# Patient Record
Sex: Male | Born: 1940 | Race: White | Hispanic: No | Marital: Married | Smoking: Never smoker
Health system: Southern US, Community
[De-identification: ages and names within clinical notes are randomized; demographics above are authoritative.]

---

## 2015-06-12 ENCOUNTER — Other Ambulatory Visit: Payer: Self-pay | Admitting: Specialist

## 2015-06-12 DIAGNOSIS — R413 Other amnesia: Secondary | ICD-10-CM

## 2015-06-20 ENCOUNTER — Other Ambulatory Visit: Payer: Self-pay

## 2015-07-28 DIAGNOSIS — I63339 Cerebral infarction due to thrombosis of unspecified posterior cerebral artery: Secondary | ICD-10-CM | POA: Insufficient documentation

## 2015-07-28 DIAGNOSIS — G609 Hereditary and idiopathic neuropathy, unspecified: Secondary | ICD-10-CM | POA: Insufficient documentation

## 2015-07-28 DIAGNOSIS — Z9181 History of falling: Secondary | ICD-10-CM | POA: Insufficient documentation

## 2015-07-28 DIAGNOSIS — I639 Cerebral infarction, unspecified: Secondary | ICD-10-CM | POA: Insufficient documentation

## 2016-09-02 ENCOUNTER — Encounter: Payer: Self-pay | Admitting: Sports Medicine

## 2016-09-02 ENCOUNTER — Ambulatory Visit (INDEPENDENT_AMBULATORY_CARE_PROVIDER_SITE_OTHER): Payer: BLUE CROSS/BLUE SHIELD | Admitting: Sports Medicine

## 2016-09-02 DIAGNOSIS — Z7901 Long term (current) use of anticoagulants: Secondary | ICD-10-CM | POA: Diagnosis not present

## 2016-09-02 DIAGNOSIS — M205X1 Other deformities of toe(s) (acquired), right foot: Secondary | ICD-10-CM | POA: Diagnosis not present

## 2016-09-02 DIAGNOSIS — M79676 Pain in unspecified toe(s): Secondary | ICD-10-CM | POA: Diagnosis not present

## 2016-09-02 DIAGNOSIS — B351 Tinea unguium: Secondary | ICD-10-CM

## 2016-09-02 NOTE — Progress Notes (Signed)
  Subjective: Ronald Sheppard is a 76 y.o. male patient seen today in office with complaint of painful thickened and elongated toenails; unable to trim especially at right 2nd toenail and want to talk about treatment options. Patient denies history of Diabetes, Neuropathy, or known Vascular disease; on Plavix for infarct. Patient has no other pedal complaints at this time.   Patient Active Problem List   Diagnosis Date Noted  . 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 Idiopathic peripheral neuropathy 07/28/2015  . Risk for falls 07/28/2015    No current outpatient prescriptions on file prior to visit.   No current facility-administered medications on file prior to visit.     Allergies  Allergen Reactions  . Amlodipine   . Other     MSG  . Penicillins     Objective: Physical Exam  General: Well developed, nourished, no acute distress, awake, alert and oriented x 3  Vascular: Dorsalis pedis artery 1/4 bilateral, Posterior tibial artery 1/4 bilateral, skin temperature warm to warm proximal to distal bilateral lower extremities, mild varicosities, scant pedal hair present bilateral.  Neurological: Gross sensation present via light touch bilateral.   Dermatological: Skin is warm, dry, and supple bilateral, Nails 1-10 are tender, long, thick, and discolored with mild subungal debris with right 2nd toenail most involved, no webspace macerations present bilateral, no open lesions present bilateral, no callus/corns/hyperkeratotic tissue present bilateral. No signs of infection bilateral.  Musculoskeletal: Asymptomatic right 2nd claw toe boney deformities noted. Muscular strength within normal limits without painon range of motion. No pain with calf compression bilateral.  Assessment and Plan:  Problem List Items Addressed This Visit    None    Visit Diagnoses    Dermatophytosis, nail    -  Primary   Pain of toe, unspecified laterality       Claw toe, right       2nd toe   Current use of long term anticoagulation          -Examined patient.  -Discussed treatment options for painful mycotic nails. -Mechanically debrided and reduced mycotic nails with sterile nail nipper and dremel nail file without incident. -Recommend daily tea tree oil to nails since patient does not want Rx or fungal culture at this time -Gave toe cap for right 2nd claw toe -Recommend good supportive shoes daily  -Patient to return as needed or sooner if symptoms worsen.  Landis Martins, DPM

## 2017-07-06 DIAGNOSIS — J209 Acute bronchitis, unspecified: Secondary | ICD-10-CM | POA: Diagnosis not present

## 2017-10-15 DIAGNOSIS — L0390 Cellulitis, unspecified: Secondary | ICD-10-CM | POA: Diagnosis not present

## 2017-10-24 DIAGNOSIS — L0390 Cellulitis, unspecified: Secondary | ICD-10-CM | POA: Diagnosis not present

## 2017-12-22 DIAGNOSIS — L97519 Non-pressure chronic ulcer of other part of right foot with unspecified severity: Secondary | ICD-10-CM | POA: Diagnosis not present

## 2017-12-29 DIAGNOSIS — E119 Type 2 diabetes mellitus without complications: Secondary | ICD-10-CM | POA: Diagnosis not present

## 2018-01-18 DIAGNOSIS — R21 Rash and other nonspecific skin eruption: Secondary | ICD-10-CM | POA: Diagnosis not present

## 2018-01-18 DIAGNOSIS — A932 Colorado tick fever: Secondary | ICD-10-CM | POA: Diagnosis not present

## 2018-03-09 DIAGNOSIS — S91012A Laceration without foreign body, left ankle, initial encounter: Secondary | ICD-10-CM | POA: Diagnosis not present

## 2018-04-19 DIAGNOSIS — H2513 Age-related nuclear cataract, bilateral: Secondary | ICD-10-CM | POA: Diagnosis not present

## 2018-04-19 DIAGNOSIS — H02834 Dermatochalasis of left upper eyelid: Secondary | ICD-10-CM | POA: Diagnosis not present

## 2018-04-19 DIAGNOSIS — H353131 Nonexudative age-related macular degeneration, bilateral, early dry stage: Secondary | ICD-10-CM | POA: Diagnosis not present

## 2018-04-19 DIAGNOSIS — H02831 Dermatochalasis of right upper eyelid: Secondary | ICD-10-CM | POA: Diagnosis not present

## 2018-04-19 DIAGNOSIS — H4323 Crystalline deposits in vitreous body, bilateral: Secondary | ICD-10-CM | POA: Diagnosis not present

## 2018-05-05 DIAGNOSIS — S0512XA Contusion of eyeball and orbital tissues, left eye, initial encounter: Secondary | ICD-10-CM | POA: Diagnosis not present

## 2018-08-04 DIAGNOSIS — H10019 Acute follicular conjunctivitis, unspecified eye: Secondary | ICD-10-CM | POA: Diagnosis not present

## 2018-10-07 DIAGNOSIS — R1311 Dysphagia, oral phase: Secondary | ICD-10-CM | POA: Diagnosis not present

## 2018-10-07 DIAGNOSIS — Z7409 Other reduced mobility: Secondary | ICD-10-CM | POA: Diagnosis not present

## 2018-10-07 DIAGNOSIS — R4182 Altered mental status, unspecified: Secondary | ICD-10-CM | POA: Diagnosis not present

## 2018-10-07 DIAGNOSIS — R319 Hematuria, unspecified: Secondary | ICD-10-CM | POA: Diagnosis not present

## 2018-10-07 DIAGNOSIS — N400 Enlarged prostate without lower urinary tract symptoms: Secondary | ICD-10-CM | POA: Diagnosis not present

## 2018-10-07 DIAGNOSIS — Z8673 Personal history of transient ischemic attack (TIA), and cerebral infarction without residual deficits: Secondary | ICD-10-CM | POA: Diagnosis not present

## 2018-10-07 DIAGNOSIS — R310 Gross hematuria: Secondary | ICD-10-CM | POA: Diagnosis not present

## 2018-10-07 DIAGNOSIS — I10 Essential (primary) hypertension: Secondary | ICD-10-CM | POA: Diagnosis not present

## 2018-10-07 DIAGNOSIS — R9401 Abnormal electroencephalogram [EEG]: Secondary | ICD-10-CM | POA: Diagnosis not present

## 2018-10-07 DIAGNOSIS — I2510 Atherosclerotic heart disease of native coronary artery without angina pectoris: Secondary | ICD-10-CM | POA: Diagnosis not present

## 2018-10-07 DIAGNOSIS — I213 ST elevation (STEMI) myocardial infarction of unspecified site: Secondary | ICD-10-CM | POA: Diagnosis not present

## 2018-10-07 DIAGNOSIS — Z6833 Body mass index (BMI) 33.0-33.9, adult: Secondary | ICD-10-CM | POA: Diagnosis not present

## 2018-10-07 DIAGNOSIS — Z955 Presence of coronary angioplasty implant and graft: Secondary | ICD-10-CM | POA: Diagnosis not present

## 2018-10-07 DIAGNOSIS — I469 Cardiac arrest, cause unspecified: Secondary | ICD-10-CM | POA: Diagnosis not present

## 2018-10-07 DIAGNOSIS — I2102 ST elevation (STEMI) myocardial infarction involving left anterior descending coronary artery: Secondary | ICD-10-CM | POA: Diagnosis not present

## 2018-10-07 DIAGNOSIS — E785 Hyperlipidemia, unspecified: Secondary | ICD-10-CM | POA: Diagnosis not present

## 2018-10-08 DIAGNOSIS — E785 Hyperlipidemia, unspecified: Secondary | ICD-10-CM | POA: Diagnosis not present

## 2018-10-08 DIAGNOSIS — R9401 Abnormal electroencephalogram [EEG]: Secondary | ICD-10-CM | POA: Diagnosis not present

## 2018-10-08 DIAGNOSIS — I2510 Atherosclerotic heart disease of native coronary artery without angina pectoris: Secondary | ICD-10-CM | POA: Diagnosis not present

## 2018-10-08 DIAGNOSIS — I2102 ST elevation (STEMI) myocardial infarction involving left anterior descending coronary artery: Secondary | ICD-10-CM | POA: Diagnosis not present

## 2018-10-08 DIAGNOSIS — Z6833 Body mass index (BMI) 33.0-33.9, adult: Secondary | ICD-10-CM | POA: Diagnosis not present

## 2018-10-08 DIAGNOSIS — N400 Enlarged prostate without lower urinary tract symptoms: Secondary | ICD-10-CM | POA: Diagnosis not present

## 2018-10-08 DIAGNOSIS — R319 Hematuria, unspecified: Secondary | ICD-10-CM | POA: Diagnosis not present

## 2018-10-08 DIAGNOSIS — R310 Gross hematuria: Secondary | ICD-10-CM | POA: Diagnosis not present

## 2018-10-08 DIAGNOSIS — I10 Essential (primary) hypertension: Secondary | ICD-10-CM | POA: Diagnosis not present

## 2018-10-08 DIAGNOSIS — I469 Cardiac arrest, cause unspecified: Secondary | ICD-10-CM | POA: Diagnosis not present

## 2018-10-08 DIAGNOSIS — Z955 Presence of coronary angioplasty implant and graft: Secondary | ICD-10-CM | POA: Diagnosis not present

## 2018-10-09 DIAGNOSIS — I2510 Atherosclerotic heart disease of native coronary artery without angina pectoris: Secondary | ICD-10-CM | POA: Diagnosis not present

## 2018-10-09 DIAGNOSIS — R9401 Abnormal electroencephalogram [EEG]: Secondary | ICD-10-CM | POA: Diagnosis not present

## 2018-10-09 DIAGNOSIS — R001 Bradycardia, unspecified: Secondary | ICD-10-CM | POA: Diagnosis not present

## 2018-10-09 DIAGNOSIS — I2102 ST elevation (STEMI) myocardial infarction involving left anterior descending coronary artery: Secondary | ICD-10-CM | POA: Diagnosis not present

## 2018-10-09 DIAGNOSIS — I10 Essential (primary) hypertension: Secondary | ICD-10-CM | POA: Diagnosis not present

## 2018-10-09 DIAGNOSIS — R310 Gross hematuria: Secondary | ICD-10-CM | POA: Diagnosis not present

## 2018-10-09 DIAGNOSIS — E785 Hyperlipidemia, unspecified: Secondary | ICD-10-CM | POA: Diagnosis not present

## 2018-10-09 DIAGNOSIS — Z6834 Body mass index (BMI) 34.0-34.9, adult: Secondary | ICD-10-CM | POA: Diagnosis not present

## 2018-10-09 DIAGNOSIS — I469 Cardiac arrest, cause unspecified: Secondary | ICD-10-CM | POA: Diagnosis not present

## 2018-10-10 DIAGNOSIS — Z8673 Personal history of transient ischemic attack (TIA), and cerebral infarction without residual deficits: Secondary | ICD-10-CM | POA: Diagnosis not present

## 2018-10-10 DIAGNOSIS — I4901 Ventricular fibrillation: Secondary | ICD-10-CM | POA: Diagnosis not present

## 2018-10-10 DIAGNOSIS — R310 Gross hematuria: Secondary | ICD-10-CM | POA: Diagnosis not present

## 2018-10-10 DIAGNOSIS — J189 Pneumonia, unspecified organism: Secondary | ICD-10-CM | POA: Diagnosis not present

## 2018-10-10 DIAGNOSIS — R600 Localized edema: Secondary | ICD-10-CM | POA: Diagnosis not present

## 2018-10-10 DIAGNOSIS — E785 Hyperlipidemia, unspecified: Secondary | ICD-10-CM | POA: Diagnosis not present

## 2018-10-10 DIAGNOSIS — Z955 Presence of coronary angioplasty implant and graft: Secondary | ICD-10-CM | POA: Diagnosis not present

## 2018-10-10 DIAGNOSIS — I2102 ST elevation (STEMI) myocardial infarction involving left anterior descending coronary artery: Secondary | ICD-10-CM | POA: Diagnosis not present

## 2018-10-10 DIAGNOSIS — I110 Hypertensive heart disease with heart failure: Secondary | ICD-10-CM | POA: Diagnosis not present

## 2018-10-10 DIAGNOSIS — I469 Cardiac arrest, cause unspecified: Secondary | ICD-10-CM | POA: Diagnosis not present

## 2018-10-10 DIAGNOSIS — I472 Ventricular tachycardia: Secondary | ICD-10-CM | POA: Diagnosis not present

## 2018-10-10 DIAGNOSIS — I2510 Atherosclerotic heart disease of native coronary artery without angina pectoris: Secondary | ICD-10-CM | POA: Diagnosis not present

## 2018-10-10 DIAGNOSIS — Z6834 Body mass index (BMI) 34.0-34.9, adult: Secondary | ICD-10-CM | POA: Diagnosis not present

## 2018-10-10 DIAGNOSIS — I5021 Acute systolic (congestive) heart failure: Secondary | ICD-10-CM | POA: Diagnosis not present

## 2018-10-11 DIAGNOSIS — I5021 Acute systolic (congestive) heart failure: Secondary | ICD-10-CM | POA: Diagnosis not present

## 2018-10-11 DIAGNOSIS — Z6834 Body mass index (BMI) 34.0-34.9, adult: Secondary | ICD-10-CM | POA: Diagnosis not present

## 2018-10-11 DIAGNOSIS — I110 Hypertensive heart disease with heart failure: Secondary | ICD-10-CM | POA: Diagnosis not present

## 2018-10-11 DIAGNOSIS — R310 Gross hematuria: Secondary | ICD-10-CM | POA: Diagnosis not present

## 2018-10-11 DIAGNOSIS — I4901 Ventricular fibrillation: Secondary | ICD-10-CM | POA: Diagnosis not present

## 2018-10-11 DIAGNOSIS — I472 Ventricular tachycardia: Secondary | ICD-10-CM | POA: Diagnosis not present

## 2018-10-11 DIAGNOSIS — I2510 Atherosclerotic heart disease of native coronary artery without angina pectoris: Secondary | ICD-10-CM | POA: Diagnosis not present

## 2018-10-11 DIAGNOSIS — Z955 Presence of coronary angioplasty implant and graft: Secondary | ICD-10-CM | POA: Diagnosis not present

## 2018-10-11 DIAGNOSIS — I469 Cardiac arrest, cause unspecified: Secondary | ICD-10-CM | POA: Diagnosis not present

## 2018-10-11 DIAGNOSIS — I2102 ST elevation (STEMI) myocardial infarction involving left anterior descending coronary artery: Secondary | ICD-10-CM | POA: Diagnosis not present

## 2018-10-11 DIAGNOSIS — J189 Pneumonia, unspecified organism: Secondary | ICD-10-CM | POA: Diagnosis not present

## 2018-10-11 DIAGNOSIS — E785 Hyperlipidemia, unspecified: Secondary | ICD-10-CM | POA: Diagnosis not present

## 2018-10-12 DIAGNOSIS — I10 Essential (primary) hypertension: Secondary | ICD-10-CM | POA: Diagnosis not present

## 2018-10-12 DIAGNOSIS — Z955 Presence of coronary angioplasty implant and graft: Secondary | ICD-10-CM | POA: Diagnosis not present

## 2018-10-12 DIAGNOSIS — I2510 Atherosclerotic heart disease of native coronary artery without angina pectoris: Secondary | ICD-10-CM | POA: Diagnosis not present

## 2018-10-12 DIAGNOSIS — I2102 ST elevation (STEMI) myocardial infarction involving left anterior descending coronary artery: Secondary | ICD-10-CM | POA: Diagnosis not present

## 2018-10-12 DIAGNOSIS — I2109 ST elevation (STEMI) myocardial infarction involving other coronary artery of anterior wall: Secondary | ICD-10-CM | POA: Diagnosis not present

## 2018-10-12 DIAGNOSIS — I469 Cardiac arrest, cause unspecified: Secondary | ICD-10-CM | POA: Diagnosis not present

## 2018-10-12 DIAGNOSIS — I472 Ventricular tachycardia: Secondary | ICD-10-CM | POA: Diagnosis not present

## 2018-10-12 DIAGNOSIS — Z8673 Personal history of transient ischemic attack (TIA), and cerebral infarction without residual deficits: Secondary | ICD-10-CM | POA: Diagnosis not present

## 2018-10-14 DIAGNOSIS — R454 Irritability and anger: Secondary | ICD-10-CM | POA: Diagnosis not present

## 2018-10-16 DIAGNOSIS — G931 Anoxic brain damage, not elsewhere classified: Secondary | ICD-10-CM | POA: Diagnosis not present

## 2018-10-16 DIAGNOSIS — Z95810 Presence of automatic (implantable) cardiac defibrillator: Secondary | ICD-10-CM | POA: Diagnosis not present

## 2018-10-16 DIAGNOSIS — I480 Paroxysmal atrial fibrillation: Secondary | ICD-10-CM | POA: Diagnosis not present

## 2018-10-16 DIAGNOSIS — M25511 Pain in right shoulder: Secondary | ICD-10-CM | POA: Diagnosis not present

## 2018-10-16 DIAGNOSIS — Z7901 Long term (current) use of anticoagulants: Secondary | ICD-10-CM | POA: Diagnosis not present

## 2018-10-16 DIAGNOSIS — E785 Hyperlipidemia, unspecified: Secondary | ICD-10-CM | POA: Diagnosis not present

## 2018-10-16 DIAGNOSIS — G8929 Other chronic pain: Secondary | ICD-10-CM | POA: Diagnosis not present

## 2018-10-16 DIAGNOSIS — I110 Hypertensive heart disease with heart failure: Secondary | ICD-10-CM | POA: Diagnosis not present

## 2018-10-16 DIAGNOSIS — I5020 Unspecified systolic (congestive) heart failure: Secondary | ICD-10-CM | POA: Diagnosis not present

## 2018-10-16 DIAGNOSIS — I213 ST elevation (STEMI) myocardial infarction of unspecified site: Secondary | ICD-10-CM | POA: Diagnosis not present

## 2018-10-16 DIAGNOSIS — I808 Phlebitis and thrombophlebitis of other sites: Secondary | ICD-10-CM | POA: Diagnosis not present

## 2018-10-16 DIAGNOSIS — Z7409 Other reduced mobility: Secondary | ICD-10-CM | POA: Diagnosis not present

## 2018-10-17 DIAGNOSIS — M25511 Pain in right shoulder: Secondary | ICD-10-CM | POA: Diagnosis not present

## 2018-10-17 DIAGNOSIS — Z7409 Other reduced mobility: Secondary | ICD-10-CM | POA: Diagnosis not present

## 2018-10-17 DIAGNOSIS — I5020 Unspecified systolic (congestive) heart failure: Secondary | ICD-10-CM | POA: Diagnosis not present

## 2018-10-17 DIAGNOSIS — I2510 Atherosclerotic heart disease of native coronary artery without angina pectoris: Secondary | ICD-10-CM | POA: Diagnosis not present

## 2018-10-17 DIAGNOSIS — E785 Hyperlipidemia, unspecified: Secondary | ICD-10-CM | POA: Diagnosis not present

## 2018-10-17 DIAGNOSIS — I4891 Unspecified atrial fibrillation: Secondary | ICD-10-CM | POA: Diagnosis not present

## 2018-10-17 DIAGNOSIS — I110 Hypertensive heart disease with heart failure: Secondary | ICD-10-CM | POA: Diagnosis not present

## 2018-10-17 DIAGNOSIS — G8929 Other chronic pain: Secondary | ICD-10-CM | POA: Diagnosis not present

## 2018-10-17 DIAGNOSIS — Z95810 Presence of automatic (implantable) cardiac defibrillator: Secondary | ICD-10-CM | POA: Diagnosis not present

## 2018-10-17 DIAGNOSIS — G931 Anoxic brain damage, not elsewhere classified: Secondary | ICD-10-CM | POA: Diagnosis not present

## 2018-10-17 DIAGNOSIS — Z7901 Long term (current) use of anticoagulants: Secondary | ICD-10-CM | POA: Diagnosis not present

## 2018-10-17 DIAGNOSIS — I2109 ST elevation (STEMI) myocardial infarction involving other coronary artery of anterior wall: Secondary | ICD-10-CM | POA: Diagnosis not present

## 2018-10-18 DIAGNOSIS — I2510 Atherosclerotic heart disease of native coronary artery without angina pectoris: Secondary | ICD-10-CM | POA: Diagnosis not present

## 2018-10-18 DIAGNOSIS — I808 Phlebitis and thrombophlebitis of other sites: Secondary | ICD-10-CM | POA: Diagnosis not present

## 2018-10-18 DIAGNOSIS — G8929 Other chronic pain: Secondary | ICD-10-CM | POA: Diagnosis not present

## 2018-10-18 DIAGNOSIS — M25511 Pain in right shoulder: Secondary | ICD-10-CM | POA: Diagnosis not present

## 2018-10-18 DIAGNOSIS — Z7409 Other reduced mobility: Secondary | ICD-10-CM | POA: Diagnosis not present

## 2018-10-18 DIAGNOSIS — I5020 Unspecified systolic (congestive) heart failure: Secondary | ICD-10-CM | POA: Diagnosis not present

## 2018-10-18 DIAGNOSIS — Z7901 Long term (current) use of anticoagulants: Secondary | ICD-10-CM | POA: Diagnosis not present

## 2018-10-18 DIAGNOSIS — I110 Hypertensive heart disease with heart failure: Secondary | ICD-10-CM | POA: Diagnosis not present

## 2018-10-18 DIAGNOSIS — E785 Hyperlipidemia, unspecified: Secondary | ICD-10-CM | POA: Diagnosis not present

## 2018-10-18 DIAGNOSIS — Z95810 Presence of automatic (implantable) cardiac defibrillator: Secondary | ICD-10-CM | POA: Diagnosis not present

## 2018-10-18 DIAGNOSIS — I4891 Unspecified atrial fibrillation: Secondary | ICD-10-CM | POA: Diagnosis not present

## 2018-10-18 DIAGNOSIS — I220 Subsequent ST elevation (STEMI) myocardial infarction of anterior wall: Secondary | ICD-10-CM | POA: Diagnosis not present

## 2018-10-19 DIAGNOSIS — M25511 Pain in right shoulder: Secondary | ICD-10-CM | POA: Diagnosis not present

## 2018-10-19 DIAGNOSIS — Z7901 Long term (current) use of anticoagulants: Secondary | ICD-10-CM | POA: Diagnosis not present

## 2018-10-19 DIAGNOSIS — Z7409 Other reduced mobility: Secondary | ICD-10-CM | POA: Diagnosis not present

## 2018-10-19 DIAGNOSIS — E785 Hyperlipidemia, unspecified: Secondary | ICD-10-CM | POA: Diagnosis not present

## 2018-10-19 DIAGNOSIS — I469 Cardiac arrest, cause unspecified: Secondary | ICD-10-CM | POA: Diagnosis not present

## 2018-10-19 DIAGNOSIS — I220 Subsequent ST elevation (STEMI) myocardial infarction of anterior wall: Secondary | ICD-10-CM | POA: Diagnosis not present

## 2018-10-19 DIAGNOSIS — I4891 Unspecified atrial fibrillation: Secondary | ICD-10-CM | POA: Diagnosis not present

## 2018-10-19 DIAGNOSIS — Z95810 Presence of automatic (implantable) cardiac defibrillator: Secondary | ICD-10-CM | POA: Diagnosis not present

## 2018-10-19 DIAGNOSIS — I5020 Unspecified systolic (congestive) heart failure: Secondary | ICD-10-CM | POA: Diagnosis not present

## 2018-10-19 DIAGNOSIS — I2510 Atherosclerotic heart disease of native coronary artery without angina pectoris: Secondary | ICD-10-CM | POA: Diagnosis not present

## 2018-10-19 DIAGNOSIS — I110 Hypertensive heart disease with heart failure: Secondary | ICD-10-CM | POA: Diagnosis not present

## 2018-10-19 DIAGNOSIS — G8929 Other chronic pain: Secondary | ICD-10-CM | POA: Diagnosis not present

## 2021-07-17 DIAGNOSIS — M545 Low back pain, unspecified: Secondary | ICD-10-CM | POA: Diagnosis not present

## 2021-07-17 DIAGNOSIS — R778 Other specified abnormalities of plasma proteins: Secondary | ICD-10-CM

## 2021-07-17 DIAGNOSIS — I249 Acute ischemic heart disease, unspecified: Secondary | ICD-10-CM | POA: Diagnosis not present

## 2021-07-17 DIAGNOSIS — R339 Retention of urine, unspecified: Secondary | ICD-10-CM

## 2021-07-17 DIAGNOSIS — I214 Non-ST elevation (NSTEMI) myocardial infarction: Secondary | ICD-10-CM | POA: Diagnosis not present

## 2021-07-17 DIAGNOSIS — I4892 Unspecified atrial flutter: Secondary | ICD-10-CM | POA: Diagnosis not present

## 2021-07-18 DIAGNOSIS — Z7901 Long term (current) use of anticoagulants: Secondary | ICD-10-CM | POA: Diagnosis not present

## 2021-07-18 DIAGNOSIS — Z950 Presence of cardiac pacemaker: Secondary | ICD-10-CM | POA: Diagnosis not present

## 2021-07-18 DIAGNOSIS — I249 Acute ischemic heart disease, unspecified: Secondary | ICD-10-CM | POA: Diagnosis not present

## 2021-07-18 DIAGNOSIS — E871 Hypo-osmolality and hyponatremia: Secondary | ICD-10-CM

## 2021-07-18 DIAGNOSIS — I4892 Unspecified atrial flutter: Secondary | ICD-10-CM | POA: Diagnosis not present

## 2021-07-18 DIAGNOSIS — I2510 Atherosclerotic heart disease of native coronary artery without angina pectoris: Secondary | ICD-10-CM | POA: Diagnosis not present

## 2021-07-19 DIAGNOSIS — Z7901 Long term (current) use of anticoagulants: Secondary | ICD-10-CM | POA: Diagnosis not present

## 2021-07-19 DIAGNOSIS — I249 Acute ischemic heart disease, unspecified: Secondary | ICD-10-CM | POA: Diagnosis not present

## 2021-07-19 DIAGNOSIS — I2510 Atherosclerotic heart disease of native coronary artery without angina pectoris: Secondary | ICD-10-CM | POA: Diagnosis not present

## 2021-07-19 DIAGNOSIS — Z950 Presence of cardiac pacemaker: Secondary | ICD-10-CM | POA: Diagnosis not present

## 2021-07-20 DIAGNOSIS — I2510 Atherosclerotic heart disease of native coronary artery without angina pectoris: Secondary | ICD-10-CM | POA: Diagnosis not present

## 2021-07-20 DIAGNOSIS — Z950 Presence of cardiac pacemaker: Secondary | ICD-10-CM | POA: Diagnosis not present

## 2021-07-20 DIAGNOSIS — Z7901 Long term (current) use of anticoagulants: Secondary | ICD-10-CM | POA: Diagnosis not present

## 2021-07-20 DIAGNOSIS — I249 Acute ischemic heart disease, unspecified: Secondary | ICD-10-CM | POA: Diagnosis not present

## 2021-07-21 DIAGNOSIS — I4892 Unspecified atrial flutter: Secondary | ICD-10-CM | POA: Diagnosis not present

## 2021-07-30 ENCOUNTER — Emergency Department (HOSPITAL_COMMUNITY): Payer: Medicare Other

## 2021-07-30 ENCOUNTER — Inpatient Hospital Stay (HOSPITAL_COMMUNITY)
Admission: EM | Admit: 2021-07-30 | Discharge: 2021-08-20 | DRG: 055 | Disposition: A | Discharge status: Hospice | Payer: Medicare Other | Source: Skilled Nursing Facility | Attending: Family Medicine | Admitting: Family Medicine

## 2021-07-30 ENCOUNTER — Other Ambulatory Visit: Payer: Self-pay

## 2021-07-30 DIAGNOSIS — Z95810 Presence of automatic (implantable) cardiac defibrillator: Secondary | ICD-10-CM

## 2021-07-30 DIAGNOSIS — Z950 Presence of cardiac pacemaker: Secondary | ICD-10-CM

## 2021-07-30 DIAGNOSIS — L8990 Pressure ulcer of unspecified site, unspecified stage: Secondary | ICD-10-CM | POA: Insufficient documentation

## 2021-07-30 DIAGNOSIS — Z8673 Personal history of transient ischemic attack (TIA), and cerebral infarction without residual deficits: Secondary | ICD-10-CM

## 2021-07-30 DIAGNOSIS — I2510 Atherosclerotic heart disease of native coronary artery without angina pectoris: Secondary | ICD-10-CM | POA: Diagnosis present

## 2021-07-30 DIAGNOSIS — R296 Repeated falls: Secondary | ICD-10-CM | POA: Diagnosis present

## 2021-07-30 DIAGNOSIS — Z888 Allergy status to other drugs, medicaments and biological substances status: Secondary | ICD-10-CM

## 2021-07-30 DIAGNOSIS — C801 Malignant (primary) neoplasm, unspecified: Secondary | ICD-10-CM

## 2021-07-30 DIAGNOSIS — R471 Dysarthria and anarthria: Secondary | ICD-10-CM | POA: Diagnosis present

## 2021-07-30 DIAGNOSIS — Z7901 Long term (current) use of anticoagulants: Secondary | ICD-10-CM

## 2021-07-30 DIAGNOSIS — I252 Old myocardial infarction: Secondary | ICD-10-CM

## 2021-07-30 DIAGNOSIS — G834 Cauda equina syndrome: Secondary | ICD-10-CM | POA: Diagnosis present

## 2021-07-30 DIAGNOSIS — Z91199 Patient's noncompliance with other medical treatment and regimen due to unspecified reason: Secondary | ICD-10-CM

## 2021-07-30 DIAGNOSIS — R609 Edema, unspecified: Secondary | ICD-10-CM

## 2021-07-30 DIAGNOSIS — Z7982 Long term (current) use of aspirin: Secondary | ICD-10-CM

## 2021-07-30 DIAGNOSIS — Z9114 Patient's other noncompliance with medication regimen: Secondary | ICD-10-CM

## 2021-07-30 DIAGNOSIS — N138 Other obstructive and reflux uropathy: Secondary | ICD-10-CM | POA: Diagnosis present

## 2021-07-30 DIAGNOSIS — Z880 Allergy status to penicillin: Secondary | ICD-10-CM

## 2021-07-30 DIAGNOSIS — I480 Paroxysmal atrial fibrillation: Secondary | ICD-10-CM | POA: Diagnosis present

## 2021-07-30 DIAGNOSIS — M5417 Radiculopathy, lumbosacral region: Secondary | ICD-10-CM | POA: Diagnosis present

## 2021-07-30 DIAGNOSIS — L89152 Pressure ulcer of sacral region, stage 2: Secondary | ICD-10-CM | POA: Diagnosis not present

## 2021-07-30 DIAGNOSIS — Z955 Presence of coronary angioplasty implant and graft: Secondary | ICD-10-CM

## 2021-07-30 DIAGNOSIS — E222 Syndrome of inappropriate secretion of antidiuretic hormone: Secondary | ICD-10-CM | POA: Diagnosis present

## 2021-07-30 DIAGNOSIS — G8929 Other chronic pain: Secondary | ICD-10-CM | POA: Diagnosis present

## 2021-07-30 DIAGNOSIS — E785 Hyperlipidemia, unspecified: Secondary | ICD-10-CM | POA: Diagnosis present

## 2021-07-30 DIAGNOSIS — Z79899 Other long term (current) drug therapy: Secondary | ICD-10-CM

## 2021-07-30 DIAGNOSIS — C7949 Secondary malignant neoplasm of other parts of nervous system: Secondary | ICD-10-CM

## 2021-07-30 DIAGNOSIS — C7932 Secondary malignant neoplasm of cerebral meninges: Principal | ICD-10-CM | POA: Diagnosis present

## 2021-07-30 DIAGNOSIS — Z20822 Contact with and (suspected) exposure to covid-19: Secondary | ICD-10-CM | POA: Diagnosis present

## 2021-07-30 DIAGNOSIS — G609 Hereditary and idiopathic neuropathy, unspecified: Secondary | ICD-10-CM | POA: Diagnosis present

## 2021-07-30 DIAGNOSIS — N401 Enlarged prostate with lower urinary tract symptoms: Secondary | ICD-10-CM | POA: Diagnosis present

## 2021-07-30 DIAGNOSIS — M25559 Pain in unspecified hip: Secondary | ICD-10-CM

## 2021-07-30 DIAGNOSIS — R3911 Hesitancy of micturition: Secondary | ICD-10-CM | POA: Diagnosis not present

## 2021-07-30 DIAGNOSIS — N368 Other specified disorders of urethra: Secondary | ICD-10-CM | POA: Diagnosis not present

## 2021-07-30 DIAGNOSIS — Z8674 Personal history of sudden cardiac arrest: Secondary | ICD-10-CM

## 2021-07-30 DIAGNOSIS — M4807 Spinal stenosis, lumbosacral region: Secondary | ICD-10-CM | POA: Diagnosis present

## 2021-07-30 DIAGNOSIS — Z66 Do not resuscitate: Secondary | ICD-10-CM | POA: Diagnosis not present

## 2021-07-30 DIAGNOSIS — Z515 Encounter for palliative care: Secondary | ICD-10-CM

## 2021-07-30 DIAGNOSIS — I10 Essential (primary) hypertension: Secondary | ICD-10-CM | POA: Diagnosis present

## 2021-07-30 DIAGNOSIS — R531 Weakness: Secondary | ICD-10-CM | POA: Diagnosis not present

## 2021-07-30 DIAGNOSIS — G2581 Restless legs syndrome: Secondary | ICD-10-CM | POA: Diagnosis not present

## 2021-07-30 LAB — CBC
HCT: 41.4 % (ref 39.0–52.0)
Hemoglobin: 13.6 g/dL (ref 13.0–17.0)
MCH: 28.3 pg (ref 26.0–34.0)
MCHC: 32.9 g/dL (ref 30.0–36.0)
MCV: 86.3 fL (ref 80.0–100.0)
Platelets: 346 10*3/uL (ref 150–400)
RBC: 4.8 MIL/uL (ref 4.22–5.81)
RDW: 13.4 % (ref 11.5–15.5)
WBC: 14.2 10*3/uL — ABNORMAL HIGH (ref 4.0–10.5)
nRBC: 0 % (ref 0.0–0.2)

## 2021-07-30 LAB — RESP PANEL BY RT-PCR (FLU A&B, COVID) ARPGX2
Influenza A by PCR: NEGATIVE
Influenza B by PCR: NEGATIVE
SARS Coronavirus 2 by RT PCR: NEGATIVE

## 2021-07-30 LAB — VITAMIN B12: Vitamin B-12: 2350 pg/mL — ABNORMAL HIGH (ref 180–914)

## 2021-07-30 LAB — BASIC METABOLIC PANEL
Anion gap: 9 (ref 5–15)
BUN: 12 mg/dL (ref 8–23)
CO2: 25 mmol/L (ref 22–32)
Calcium: 8.6 mg/dL — ABNORMAL LOW (ref 8.9–10.3)
Chloride: 93 mmol/L — ABNORMAL LOW (ref 98–111)
Creatinine, Ser: 0.75 mg/dL (ref 0.61–1.24)
GFR, Estimated: >60 mL/min (ref >60)
Glucose, Bld: 121 mg/dL — ABNORMAL HIGH (ref 70–99)
Potassium: 4.1 mmol/L (ref 3.5–5.1)
Sodium: 127 mmol/L — ABNORMAL LOW (ref 135–145)

## 2021-07-30 LAB — PROTIME-INR
INR: 1.2 (ref 0.8–1.2)
Prothrombin Time: 15.3 seconds — ABNORMAL HIGH (ref 11.4–15.2)

## 2021-07-30 LAB — APTT: aPTT: 32 seconds (ref 24–36)

## 2021-07-30 LAB — CBG MONITORING, ED: Glucose-Capillary: 127 mg/dL — ABNORMAL HIGH (ref 70–99)

## 2021-07-30 IMAGING — CT CT L SPINE W/O CM
3 series · 13 of 33 positions shown, 16 images · non-contrast
Comparison: CT [DATE]

CLINICAL DATA: Low back pain



[Series 3: l-spine 2.0 st · axial · 0.37mm/px · z∈[-733,-525]mm · 5 of 150 slices shown, 7 images]
[im 23/150  soft-tissue]
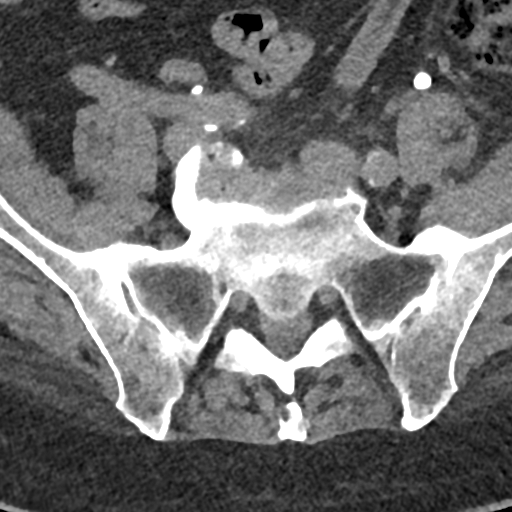
[im 23/150  bone]
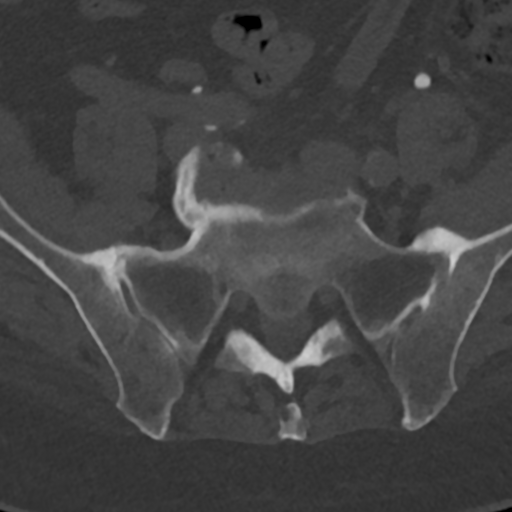
[im 46/150  bone]
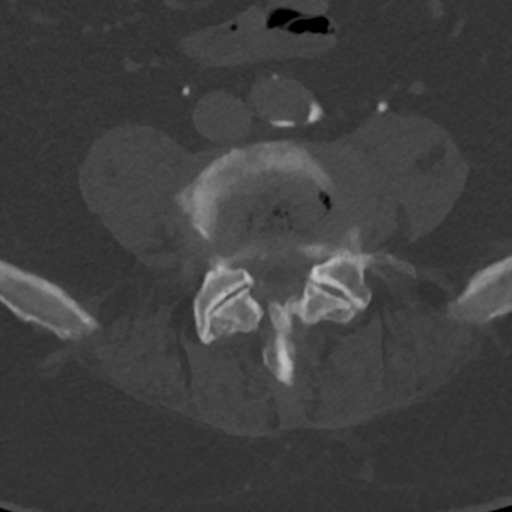
[im 81/150  bone]
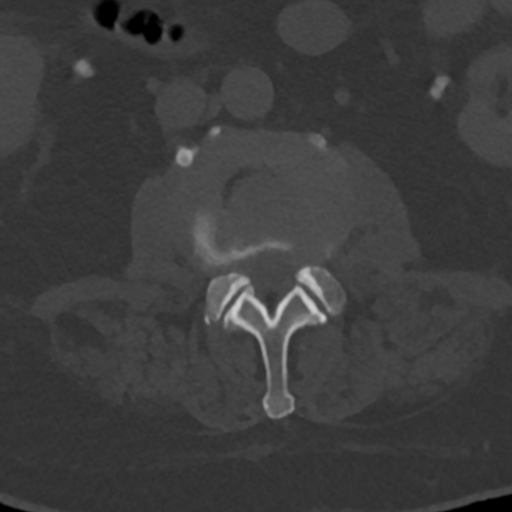
[im 104/150  bone]
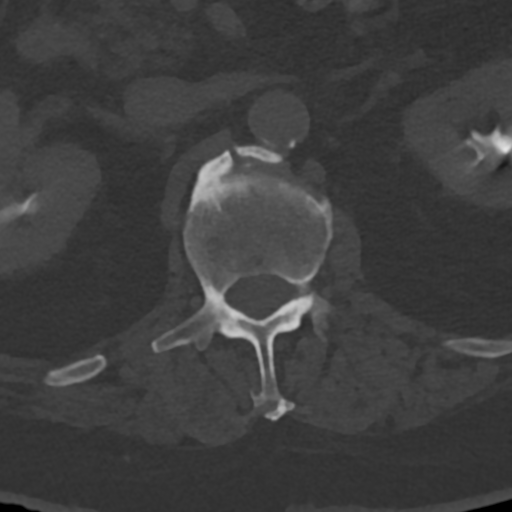
[im 127/150  soft-tissue]
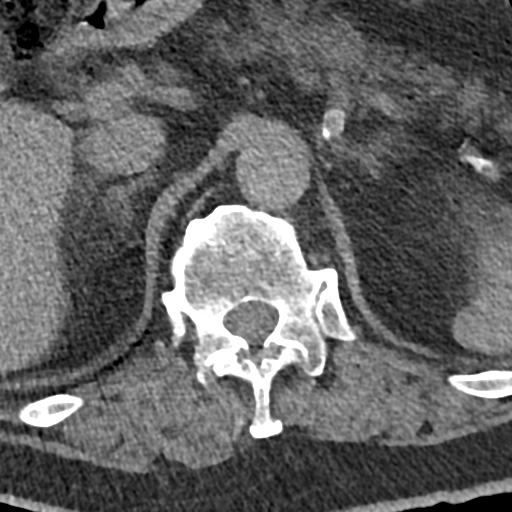
[im 127/150  bone]
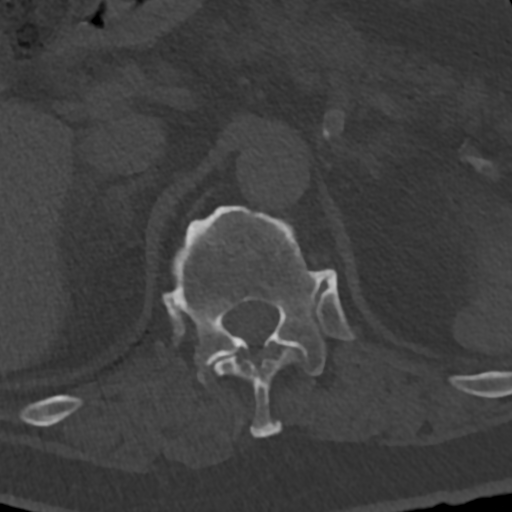

[Series 7: l-spine 2.0 cor · coronal · 0.44mm/px · 3 of 94 slices shown]
[im 19/94  bone]
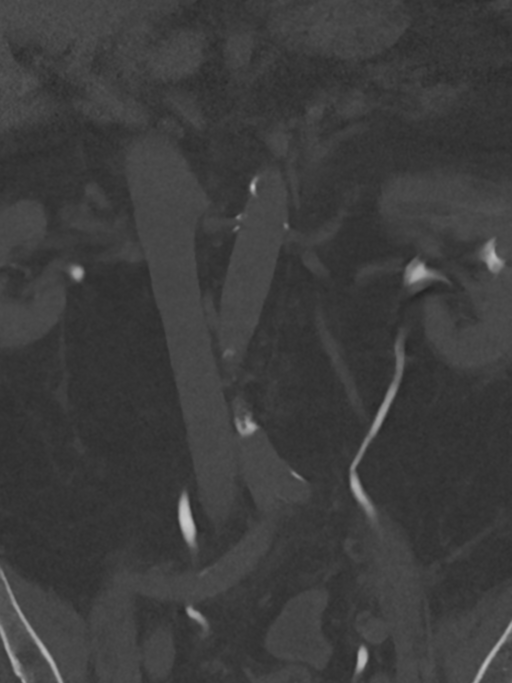
[im 38/94  bone]
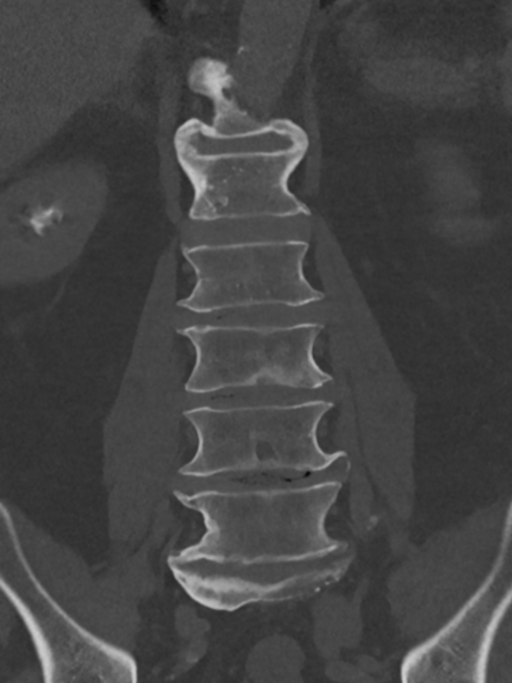
[im 56/94  bone]
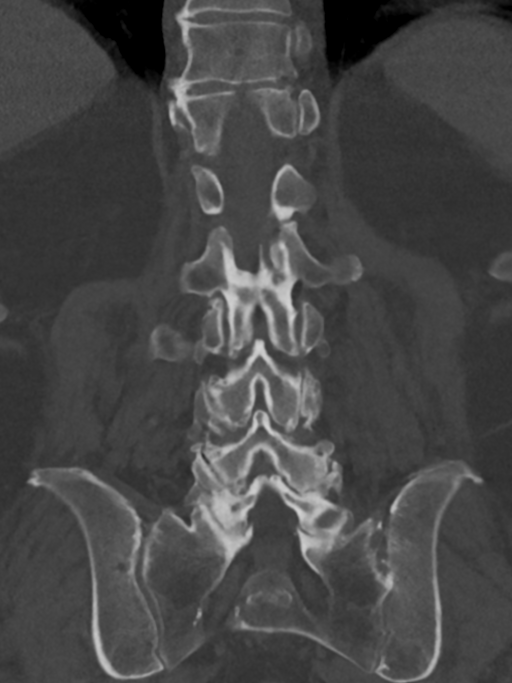

[Series 8: l-spine 2.0 sag · sagittal · 0.44mm/px · 5 of 86 slices shown, 6 images]
[im 29/86  bone]
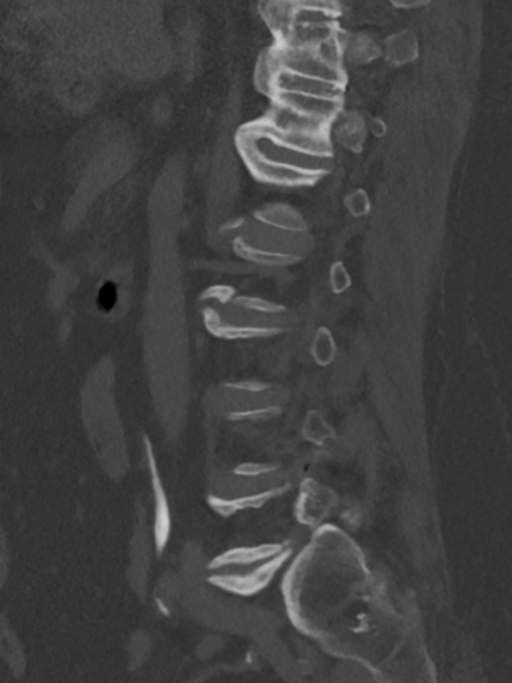
[im 36/86  bone]
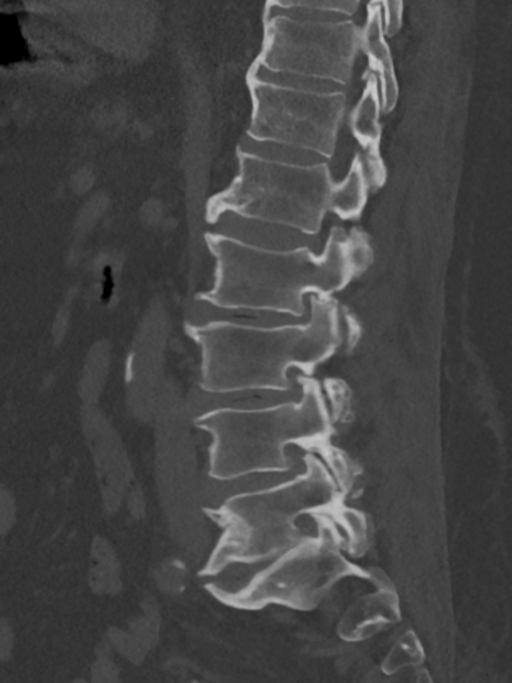
[im 43/86  soft-tissue]
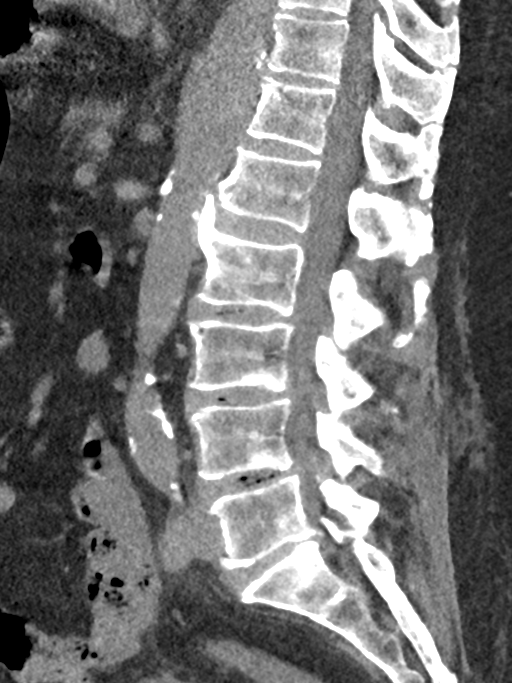
[im 43/86  bone]
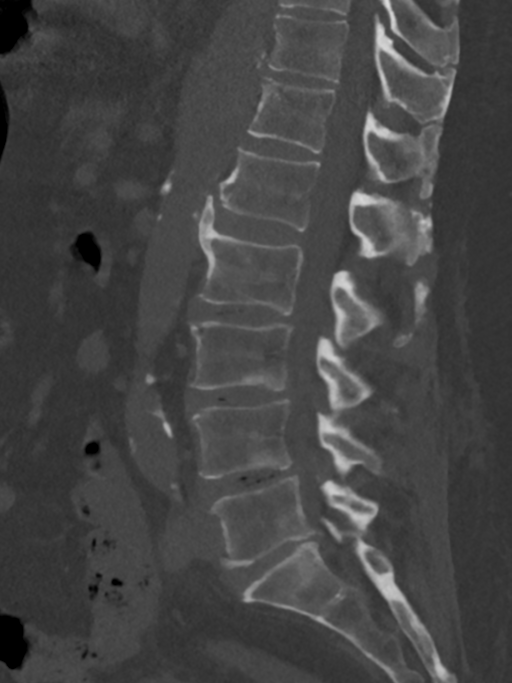
[im 50/86  bone]
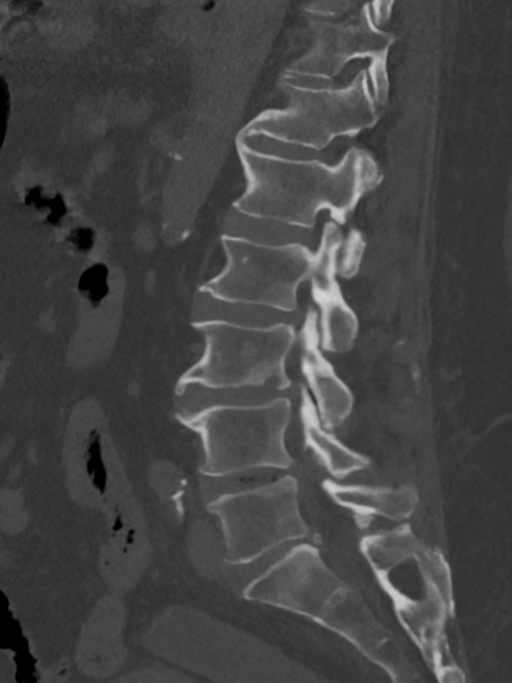
[im 57/86  bone]
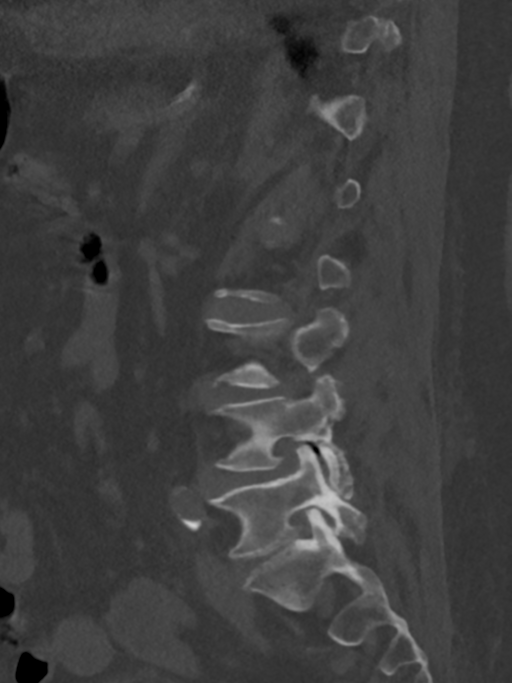

[13 of 33 positions shown; findings below may reference images not displayed]

FINDINGS: Segmentation: 5 lumbar type vertebrae.

Alignment: Normal.

Vertebrae: No acute fracture or focal pathologic process.

Paraspinal and other soft tissues: Aortic atherosclerosis. No
paravertebral or paraspinal soft tissue abnormality.

Disc levels:

At T12-L1, maintained disc space. No canal stenosis. The foramen are
patent bilaterally.

At L1-L2, maintained disc space. No canal stenosis. Mild bilateral
foraminal narrowing.

At L2-L3, disc space narrowing and below. Hypertrophic facet
degenerative changes. Diffuse disc bulge with mild canal stenosis.
Right greater than left foraminal narrowing.

At L3-L4, disc space narrowing and vacuum disc. Diffuse disc bulge
with moderate canal stenosis. Moderate facet degenerative changes
bilaterally. Moderate bilateral foraminal narrowing.

At L4-L5, disc space narrowing and vacuum disc. Moderate canal
stenosis. Hypertrophic facet degenerative changes. Moderate
bilateral foraminal stenosis.

At L5-S1, disc space narrowing. Diffuse disc bulge. No canal
stenosis. Advanced hypertrophic facet degenerative changes. Moderate
bilateral foraminal narrowing.
IMPRESSION: 1. No acute osseous abnormality.
2. Redemonstrated advanced lumbar degenerative changes with at least
moderate canal stenosis at L3-L4 and L4-L5. Multilevel foraminal
stenosis L2-L3 through L5-S1.

## 2021-07-30 MED ORDER — IOHEXOL 350 MG/ML SOLN
75.0000 mL | Freq: Once | INTRAVENOUS | Status: AC | PRN
  Administered 2021-07-30: 75 mL via INTRAVENOUS

## 2021-07-30 MED ORDER — MORPHINE SULFATE (PF) 2 MG/ML IV SOLN
2.0000 mg | Freq: Once | INTRAVENOUS | Status: AC
  Administered 2021-07-30: 2 mg via INTRAVENOUS
  Filled 2021-07-30: qty 1

## 2021-07-30 MED ORDER — SODIUM CHLORIDE 0.9 % IV BOLUS
1000.0000 mL | Freq: Once | INTRAVENOUS | Status: AC
  Administered 2021-07-30: 1000 mL via INTRAVENOUS

## 2021-07-30 NOTE — ED Triage Notes (Addendum)
Pt here from [facility] for stroke like symptoms. Pt has had L leg weakness since 1430 yesterday, and pt endorses slurred speech since Monday. Pt reports hx of stroke w/ no deficits. C/o of back pain, pt has been bed bound for 1wk due to rehab for back pain. 122/84, 86HR, 98% RA, CBG 174. Pt has demand defibrillator/pacemaker

## 2021-07-30 NOTE — ED Notes (Signed)
Patient is resting comfortably. 

## 2021-07-30 NOTE — H&P (Addendum)
[facility] Admission History and Physical Service Pager: [phone]  Patient name: Markice Torbert Medical record number: [id number] Date of birth: Oct 04, 1940 Age: 81 y.o. Gender: male  Primary Care Provider: Raina Mina., MD Consultants: Neuro Code Status: Full Preferred Emergency Contact:  Contact Information     Name Relation Home Work [place] Daughter [phone]  [phone]        Chief Complaint: Fall  Assessment and Plan: Ugo Thoma is a 81 y.o. male presenting with fall. PMH is significant for MI w/ pacemake and defibrillator CAD s/p stent, cerebellar stroke, HTN, HLD, PAF, BPH   Fall Patient present to the ED after a recent fall 3 days ago.  His daughter report patient has had multiple falls within the last couple of weeks and mostly attributed to imbalance.  She has denies any shortness of breath chest pain or dizziness prior to the fall.  Does endorse significant back pain which have made ambulation difficult and was recently hospitalized a week ago at a Hospital in [place] for urine retention. He was discharged with recommendations for outpatient PT/OT.  His vitals were stable on admission. initial labs significant for hyponatremia with Na 127, K4.1 and Cr 0.75. CBC showed elevated WBC of 14.2 likely due to acute stress.  Head CT showed no acute cranial hemorrhage but showed multifocal posterior cerebral artery stenosis.  Lumbar spine CT showed multilevel lumbar foraminal stenosis.  Given patient's imbalance with ambulation, back pain, recent hospitalization for urinary retention and lumbar spine CT finding is highly suspicious of spinal radiculopathy. Other possible causes to take into consideration includes stroke given patient's previous history of stroke and left lower leg weakness and slurred speech on exam.  This is possible multifactorial, combination of  spine radiculopathy and stroke. Hyponatremia is another possibility  given patient's Na of 127 on admission.  We will continue to assess patient for other possible causes.  - Admit to progressive with FPTS, attending Dr. Andria Frames -Neurology consulted in the ER, appreciate recs - Continuous cardiac monitoring -PT/OT eval and treat -Neurochecks Q2H - Continue Eliquis 5 mg twice daily - Lipid panel - Hemoglobin A1c - Echocardiogram -Fall precaution -Continue routine vitals -SCDs for VTE prophylaxis  Rosanne Gutting retention Patient reports several weeks of difficulty urinating which she thought was related to his diet.  Reports that over the last few days he has felt like his abdomen is more full and painful.  Since having Foley placed and draining bladder he has considerable relief in his abdomen.  Likely cause would be new CVA.  Is on Flomax 0.4 mg daily at bedtime. -Due to likely CVA being remote will restart daily Flomax 0.4 mg at bedtime. - Foley will remain in for 24 hours and then may try voiding trial.  Hx of cerebellar stroke Per daughter patient had a stroke in 2017 with residual defects in balance, work and vision requiring physical therapy. Patient reports his fall it felt like his left leg gave out on him.   Denies any headache, change in vision, chest pain or loss of consciousness.  On exam patient has 3/5 LLE muscle strength otherwise rest of his neuro exam was normal. He has slurred speech which is hard to tell if this is his baseline. CT head showed no acute intracranial hemorrhage or mass effect showed multifocal posterior cerebral artery stenosis.  Patient's previous history of a stroke and affecting his balance due to some concerns that his recent multiple falls could be attributed to  CT findings.  Neurology will be consulted for further assessment and recommendation will be appreciated. -Neurology consulted, appreciate their assistance -Fall precautions - Continue Eliquis 5 mg twice daily -PT/OT treat and eval  Myocardial infarction   CAD with  stents Per patient's daughter patient had an MI in 2020 requiring hospitalization and had pacemaker and defibrillator implant during this hospitalization. He  also had stent placement in LAD and Cx for CAD. Unable to determine his entire medication history. Will restart home medications once verified -Continue cardiac monitoring  Hx of PAF on Eliquis  Patient is EKG on admission shows atrial fibrillation.  He denies having any chest pains, palpitations or shortness of breath.  His home medication includes diltiazem 20 mg daily and Eliquis 5 mg twice daily. -Continue home medication -Continue cardiac monitoring  HLD Patient's most recent lipid panel 4 months ago showed cholesterol 238, LDL 165, HDL 50 and triglycerides 145.  Per chart review patient supposed to be on atorvastatin 40 mg daily however this has not been verified.  We will restart medication once medication reconciliation is completed. -Restart home meds after med rec completed.  HTN Patient has been normotensive on admission with blood pressure of 118/89.  Per chart review shows patient is supposed to be on home medication of losartan 50 mg daily, Cardizem 120 mg daily.  We will restart home medications once med rec is complete and medication is confirmed. -Restart medications once med rec completed.  FEN/GI: Carb modified diet Prophylaxis: Eliquis  Disposition: Admit to med telemetry  History of Present Illness:  Manases Etchison is a 81 y.o. male presenting with recent fall.  Patient report he has been having chronic back pain that has affected his ambulation recently and related to multiple falls in the last couple of weeks.  Most recent fall was 3 days ago and patient said his left leg gave out on him.  He felt like his left leg instantly gave out which made him lose his balance and fall backwards.  Patient denies having any dizziness chest pain or palpitations prior to fall.  There was no loss of consciousness.  Of note he was  recently hospitalized in [place] for urine retention.  He said that he was unable to pass urine at the time of discharge was recommended to start physical therapy.  And expressed displeasure with physical therapy and having difficulty with his back with physical therapy.  Patient's denies drinking, tobacco use and drug use.  He lives alone and is independent.  Review Of Systems: Per HPI with the following additions:   Review of Systems  Constitutional:  Negative for chills, fatigue and fever.  Respiratory:  Negative for choking, chest tightness and shortness of breath.   Cardiovascular:  Negative for chest pain, palpitations and leg swelling.  Musculoskeletal:  Positive for arthralgias, back pain and gait problem. Negative for joint swelling.  Neurological:  Positive for weakness. Negative for dizziness, light-headedness, numbness and headaches.    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  Past Surgical History: No past surgical history on file  Social History: Social History   Tobacco Use   Smoking status: Unknown   Smokeless tobacco: Never   Additional social history:   Please also refer  to relevant sections of EMR.  Family History: No family history on file.   Allergies and Medications: Allergies  Allergen Reactions   Amlodipine    Other     MSG   Penicillins    No current facility-administered medications on file prior to encounter.   Current Outpatient Medications on File Prior to Encounter  Medication Sig Dispense Refill   aspirin (GOODSENSE ASPIRIN) 325 MG tablet Take 325 mg by mouth.     B Complex Vitamins (VITAMIN-B COMPLEX) TABS Take by mouth.     Cholecalciferol (VITAMIN D3) 1000 units  CAPS Take by mouth.     Co-Enzyme Q-10 30 MG CAPS Take 30 mg by mouth.     halobetasol (ULTRAVATE) 0.05 % cream Apply topically.     HAWTHORN PO Take by mouth.     levofloxacin (LEVAQUIN) 500 MG tablet Take 500 mg by mouth.     losartan (COZAAR) 50 MG tablet TAKE ONE TABLET BY MOUTH EVERY DAY     magnesium citrate (V-R MAGNESIUM CITRATE) SOLN Take by mouth.     Multiple Vitamin (MULTI-VITAMINS) TABS Take by mouth.     pravastatin (PRAVACHOL) 10 MG tablet Take 10 mg by mouth.     Saw Palmetto, Serenoa repens, (SAW PALMETTO PO) Take by mouth.     vitamin E 400 UNIT capsule Take by mouth.      Objective: BP 100/65    Pulse 86    Temp (!) 97.5 F (36.4 C) (Oral)    Resp 18    SpO2 100%  Exam: General:Awake, well appearing, NAD HEENT: Atraumatic, MMM CV: RRR, no murmurs, normal S1/S2 Pulm: CTAB, good WOB on RA, no crackles or wheezing Abd: Soft, no distension, no tenderness Skin: dry, warm Ext: No BLE edema, +2 Pedal and radial pulse.  Neuro: Oriented x3, slurred speech,  LLE muscle strength 3/5, RUE, RLE & LLE 5/5 muscle strength. Sensation intact. Pupil are equally reactive to light. S Psych: Pleasant affect   Labs and Imaging: CBC BMET  Recent Labs  Lab 07/30/21 1236  WBC 14.2*  HGB 13.6  HCT 41.4  PLT 346   Recent Labs  Lab 07/30/21 1236  NA 127*  K 4.1  CL 93*  CO2 25  BUN 12  CREATININE 0.75  GLUCOSE 121*  CALCIUM 8.6*     EKG: Atrial fibrillation  CT ANGIO HEAD NECK W WO CM  Result Date: 07/30/2021 CLINICAL DATA:  Left lower extremity weakness EXAM: CT ANGIOGRAPHY HEAD AND NECK TECHNIQUE: Multidetector CT imaging of the head and neck was performed using the standard protocol during bolus administration of intravenous contrast. Multiplanar CT image reconstructions and MIPs were obtained to evaluate the vascular anatomy. Carotid stenosis measurements (when applicable) are obtained utilizing NASCET criteria, using the distal internal carotid diameter as the  denominator. RADIATION DOSE REDUCTION: This exam was performed according to the departmental dose-optimization program which includes automated exposure control, adjustment of the mA and/or kV according to patient size and/or use of iterative reconstruction technique. CONTRAST:  29mL OMNIPAQUE IOHEXOL 350 MG/ML SOLN COMPARISON:  None. FINDINGS: CT HEAD FINDINGS Brain: No acute hemorrhage. Multiple old cerebellar infarcts. There is generalized atrophy without lobar predilection. Old left occipital infarct and findings of chronic small vessel ischemia. Skull: The visualized skull base, calvarium and extracranial soft tissues are normal. Sinuses/Orbits: No fluid levels or advanced mucosal thickening of the visualized paranasal sinuses. No mastoid or middle ear effusion. The orbits are normal. CTA NECK FINDINGS SKELETON: There is no bony  spinal canal stenosis. No lytic or blastic lesion. OTHER NECK: Normal pharynx, larynx and major salivary glands. No cervical lymphadenopathy. Unremarkable thyroid gland. UPPER CHEST: No pneumothorax or pleural effusion. No nodules or masses. AORTIC ARCH: There is no calcific atherosclerosis of the aortic arch. There is no aneurysm, dissection or hemodynamically significant stenosis of the visualized portion of the aorta. Conventional 3 vessel aortic branching pattern. The visualized proximal subclavian arteries are widely patent. RIGHT CAROTID SYSTEM: No dissection, occlusion or aneurysm. Mild atherosclerotic calcification at the carotid bifurcation without hemodynamically significant stenosis. LEFT CAROTID SYSTEM: No dissection, occlusion or aneurysm. Mild atherosclerotic calcification at the carotid bifurcation without hemodynamically significant stenosis. VERTEBRAL ARTERIES: Right dominant configuration. Both origins are clearly patent. Multifocal atherosclerotic irregularity of the left vertebral artery. There is occlusion of the proximal left V4 segment with distal reconstitution.  CTA HEAD FINDINGS POSTERIOR CIRCULATION: --Vertebral arteries: Occlusion versus short segment severe stenosis of the distal right V4 segment. --Inferior cerebellar arteries: Normal. --Basilar artery: Basilar artery is diffusely diminutive. --Superior cerebellar arteries: Normal. --Posterior cerebral arteries (PCA): Multifocal severe stenoses of both posterior cerebral arteries. ANTERIOR CIRCULATION: --Intracranial internal carotid arteries: Atherosclerotic calcification with moderate narrowing bilaterally. --Anterior cerebral arteries (ACA): Normal. Both A1 segments are present. Patent anterior communicating artery (a-comm). --Middle cerebral arteries (MCA): Multifocal atherosclerotic irregularity without occlusion or high-grade stenosis. VENOUS SINUSES: As permitted by contrast timing, patent. ANATOMIC VARIANTS: None Review of the MIP images confirms the above findings. IMPRESSION: 1. No acute intracranial hemorrhage or mass effect. 2. 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 3. No occlusion or hemodynamically significant stenosis in the neck. Electronically Signed   By: Ulyses Jarred M.D.   On: 07/30/2021 21:30   CT Lumbar Spine Wo Contrast  Result Date: 07/30/2021 CLINICAL DATA:  Low back pain EXAM: CT LUMBAR SPINE WITHOUT CONTRAST TECHNIQUE: Multidetector CT imaging of the lumbar spine was performed without intravenous contrast administration. Multiplanar CT image reconstructions were also generated. RADIATION DOSE REDUCTION: This exam was performed according to the departmental dose-optimization program which includes automated exposure control, adjustment of the mA and/or kV according to patient size and/or use of iterative reconstruction technique. COMPARISON:  CT 07/17/2021 FINDINGS: Segmentation: 5 lumbar type vertebrae. Alignment: Normal. Vertebrae: No acute fracture or focal pathologic process. Paraspinal and other soft tissues:  Aortic atherosclerosis. No paravertebral or paraspinal soft tissue abnormality. Disc levels: At T12-L1, maintained disc space. No canal stenosis. The foramen are patent bilaterally. At L1-L2, maintained disc space. No canal stenosis. Mild bilateral foraminal narrowing. At L2-L3, disc space narrowing and below. Hypertrophic facet degenerative changes. Diffuse disc bulge with mild canal stenosis. Right greater than left foraminal narrowing. At L3-L4, disc space narrowing and vacuum disc. Diffuse disc bulge with moderate canal stenosis. Moderate facet degenerative changes bilaterally. Moderate bilateral foraminal narrowing. At L4-L5, disc space narrowing and vacuum disc. Moderate canal stenosis. Hypertrophic facet degenerative changes. Moderate bilateral foraminal stenosis. At L5-S1, disc space narrowing. Diffuse disc bulge. No canal stenosis. Advanced hypertrophic facet degenerative changes. Moderate bilateral foraminal narrowing. IMPRESSION: 1. No acute osseous abnormality. 2. 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 Multilevel foraminal stenosis L2-L3 through L5-S1. Electronically Signed   By: Donavan Foil M.D.   On: 07/30/2021 22:12     Alen Bleacher, MD 07/30/2021, 11:27 PM PGY-1, [facility] Intern pager: [phone], text pages welcome   FPTS Upper-Level Resident Addendum   I have independently interviewed and  examined the patient. I have discussed the above with the original author and agree with their documentation. Please see also any attending notes.   Gifford Shave, MD PGY-3, [facility] 07/31/2021 6:13 AM  FPTS Service pager: [phone] (text pages welcome through [facility])

## 2021-07-30 NOTE — ED Notes (Signed)
Pt back from CT, moved into stretcher from triage chair. Pt resting comfortably in stretcher. Pt resting with eyes closed, rise and fall of pt chest noted. Plan of care continues.

## 2021-07-30 NOTE — ED Provider Triage Note (Signed)
Emergency Medicine Provider Triage Evaluation Note  Kendal Raffo , a 81 y.o. male  was evaluated in triage.  Pt complains of decreased sensation to LLE onset yesterday. Hasn't tried any medications for his symptoms. Denies chest pain, shortness of breath, fever, chills, abdominal pain, nausea, vomiting.  Per patient paperwork from [facility] facility: It is noted that he was given a diagnosis of hemiplegia affecting the left side on 07/23/2021. No workup or ED evaluation noted in our system.   Review of Systems  Positive: As per HPI above Negative: Chest pain, shortness of breath  Physical Exam  BP 118/89 (BP Location: Right Arm)    Pulse 79    Temp (!) 97.5 F (36.4 C) (Oral)    Resp 20    SpO2 97%  Gen:   Awake, no distress   Resp:  Normal effort  MSK:   Moves extremities without difficulty  Other:  Alert and oriented x4.  Decreased sensation noted to right lower extremity below the level of the knee.  Medical Decision Making  Medically screening exam initiated at 12:26 PM.  Appropriate orders placed.  Cassell Voorhies was informed that the remainder of the evaluation will be completed by another provider, this initial triage assessment does not replace that evaluation, and the importance of remaining in the ED until their evaluation is complete.   Gabriela Irigoyen A, PA-C 07/30/21 1230

## 2021-07-30 NOTE — ED Notes (Signed)
Pt reports he is here for not being able to move his legs, reports numbness on L leg, states this has been going on since he was last in the hospital

## 2021-07-30 NOTE — ED Provider Notes (Signed)
[facility] EMERGENCY DEPARTMENT Provider Note   CSN: [id number] Arrival date & time: 07/30/21  1200     History  Chief Complaint  Patient presents with   Weakness    Ronald Sheppard is a 81 y.o. male This is a patient with a complicated recent past medical history.  Some weakness and balance issues for around 1 month, left greater than right.  Patient had a fall shortly after onset of the balance issues.  He has been seen twice for back spasms, received treatment with muscle relaxant, and pain control with minimal relief.  After his fall he was admitted due to ongoing balance issues, hyponatremia, slurred speech.  He has been in a SNF for the last week for ongoing weakness, he was sent to the emergency department for evaluation of stroke after physical therapy today because of ongoing left-sided weakness.  Patient is also having some difficulty with urinary retention, has Foley in place at this time.  Patient endorses some significant pain in the back, denies abdominal pain, dysuria, chest pain, shortness of breath.  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 fib, with pacemaker in place.  Patient is normally noncompliant with his rate and blood thinner medications for his A. fib, however he has been compliant for the last 2 weeks since hospitalization and recent SNF placement.  Continued left-sided weakness, slurred speech today, urinary retention, concern for stroke work-up.   Weakness     Home Medications Prior to Admission medications   Medication Sig Start Date End Date Taking? Authorizing Provider  acetaminophen (TYLENOL) 325 MG tablet Take 650 mg by mouth every 8 (eight) hours as needed for mild pain.   Yes [provider]  Arginine 1000 MG TABS Take 1 tablet by mouth 2 (two) times daily.   Yes [provider]  Ascorbic Acid (VITAMIN C) 1000 MG tablet Take 1,000 mg by mouth daily.  **pt takes 4 daily**   Yes [provider]  aspirin EC 81 MG tablet Take 81 mg by mouth daily. Swallow whole.   Yes [provider]  atorvastatin (LIPITOR) 40 MG tablet Take 40 mg by mouth daily.   Yes [provider]  Cholecalciferol (VITAMIN D3) 1000 units CAPS Take by mouth.   Yes [provider]  Co-Enzyme Q-10 30 MG CAPS Take 30 mg by mouth.   Yes [provider]  cyclobenzaprine (FLEXERIL) 10 MG tablet Take 10 mg by mouth 3 (three) times daily as needed for muscle spasms. 07/16/21  Yes [provider]  diltiazem (CARDIZEM CD) 120 MG 24 hr capsule Take 120 mg by mouth daily. 06/12/21  Yes [provider]  ELIQUIS 5 MG TABS tablet Take 5 mg by mouth 2 (two) times daily. 06/12/21  Yes [provider]  Levocarnitine 500 MG TABS Take 1 tablet by mouth daily.   Yes [provider]  Magnesium 400 MG TABS Take 1 tablet by mouth daily.   Yes [provider]  melatonin 3 MG TABS tablet Take 3 mg by mouth at bedtime. Pt takes 2 nightly   Yes [provider]  tamsulosin (FLOMAX) 0.4 MG CAPS capsule Take 0.4 mg by mouth at bedtime.   Yes [provider]  traMADol (ULTRAM) 50 MG tablet Take 50 mg by mouth every 6 (six) hours as needed for moderate pain.   Yes [provider]  tuberculin 5 UNIT/0.1ML injection Inject  5 Units into the skin once. Every evening   Yes [provider]  vitamin C (ASCORBIC ACID) 500 MG tablet Take 500 mg by mouth 2 (two) times daily.   Yes [provider]  ZINC SULFATE PO Take 50 mg by mouth daily.   Yes [provider]  aspirin 325 MG tablet Take 325 mg by mouth. Patient not taking: Reported on 07/31/2021    [provider]  B Complex Vitamins (VITAMIN-B COMPLEX) TABS Take by mouth. Patient not taking: Reported on 07/31/2021    [provider]  halobetasol (ULTRAVATE) 0.05 % cream Apply topically. Patient not taking:  Reported on 07/31/2021    [provider]  HAWTHORN PO Take by mouth. Patient not taking: Reported on 07/31/2021    [provider]  levofloxacin (LEVAQUIN) 500 MG tablet Take 500 mg by mouth. Patient not taking: Reported on 07/31/2021    [provider]  losartan (COZAAR) 50 MG tablet TAKE ONE TABLET BY MOUTH EVERY DAY Patient not taking: Reported on 07/31/2021 01/23/16   [provider]  magnesium citrate SOLN Take by mouth.    [provider]  Multiple Vitamin (MULTI-VITAMINS) TABS Take by mouth. Patient not taking: Reported on 07/31/2021    [provider]  pravastatin (PRAVACHOL) 10 MG tablet Take 10 mg by mouth. Patient not taking: Reported on 07/31/2021 07/17/15   [provider]  Saw Palmetto, Serenoa repens, (SAW PALMETTO PO) Take by mouth. Patient not taking: Reported on 07/31/2021    [provider]  vitamin E 400 UNIT capsule Take by mouth. Patient not taking: Reported on 07/31/2021    [provider]      Allergies    Amlodipine, Other, and Penicillins    Review of Systems   Review of Systems  Genitourinary:  Positive for difficulty urinating.  Neurological:  Positive for weakness.  All other systems reviewed and are negative.  Physical Exam Updated Vital Signs BP (!) 154/84    Pulse 83    Temp (!) 97.5 F (36.4 C) (Oral)    Resp 18    SpO2 96%  Physical Exam Vitals and nursing note reviewed.  Constitutional:      General: He is not in acute distress.    Appearance: Normal appearance. He is obese.  HENT:     Head: Normocephalic and atraumatic.  Eyes:     General:        Right eye: No discharge.        Left eye: No discharge.  Cardiovascular:     Rate and Rhythm: Normal rate and regular rhythm.     Heart sounds: No murmur heard.   No friction rub. No gallop.  Pulmonary:     Effort: Pulmonary effort is normal.     Breath sounds: Normal breath sounds.  Abdominal:     General: Bowel  sounds are normal.     Palpations: Abdomen is soft.  Skin:    General: Skin is warm and dry.     Capillary Refill: Capillary refill takes less than 2 seconds.  Neurological:     Mental Status: He is alert and oriented to person, place, and time.     Comments: CN II through XII grossly intact.  Alert and oriented only to self.  Intact finger-nose.  Romberg negative decreased from baseline, patient with left greater than right weakness of the left lower extremity.  Intact strength of the upper extremities.  Patient does answer most questions appropriately with some  delay, hesitancy.  Psychiatric:        Mood and Affect: Mood normal.        Behavior: Behavior normal.    ED Results / Procedures / Treatments   Labs (all labs ordered are listed, but only abnormal results are displayed) Labs Reviewed  BASIC METABOLIC PANEL - Abnormal; Notable for the following components:      Result Value   Sodium 127 (*)    Chloride 93 (*)    Glucose, Bld 121 (*)    Calcium 8.6 (*)    All other components within normal limits  CBC - Abnormal; Notable for the following components:   WBC 14.2 (*)    All other components within normal limits  PROTIME-INR - Abnormal; Notable for the following components:   Prothrombin Time 15.3 (*)    All other components within normal limits  VITAMIN B12 - Abnormal; Notable for the following components:   Vitamin B-12 2,350 (*)    All other components within normal limits  CBC - Abnormal; Notable for the following components:   WBC 15.7 (*)    All other components within normal limits  CBG MONITORING, ED - Abnormal; Notable for the following components:   Glucose-Capillary 127 (*)    All other components within normal limits  RESP PANEL BY RT-PCR (FLU A&B, COVID) ARPGX2  APTT  URINALYSIS, ROUTINE W REFLEX MICROSCOPIC  CREATININE, SERUM  HEMOGLOBIN A1C  TSH  BASIC METABOLIC PANEL  CBC    EKG EKG Interpretation  Date/Time:  Thursday July 30 2021 12:06:15  EST Ventricular Rate:  76 PR Interval:    QRS Duration: 102 QT Interval:  402 QTC Calculation: 452 R Axis:   -24 Text Interpretation: Atrial fibrillation Anteroseptal infarct , age undetermined Abnormal ECG No previous ECGs available Confirmed by Octaviano Glow [phone]) on 07/30/2021 4:23:23 PM  Radiology CT ANGIO HEAD NECK W WO CM  Result Date: 07/30/2021 CLINICAL DATA:  Left lower extremity weakness EXAM: CT ANGIOGRAPHY HEAD AND NECK TECHNIQUE: Multidetector CT imaging of the head and neck was performed using the standard protocol during bolus administration of intravenous contrast. Multiplanar CT image reconstructions and MIPs were obtained to evaluate the vascular anatomy. Carotid stenosis measurements (when applicable) are obtained utilizing NASCET criteria, using the distal internal carotid diameter as the denominator. RADIATION DOSE REDUCTION: This exam was performed according to the departmental dose-optimization program which includes automated exposure control, adjustment of the mA and/or kV according to patient size and/or use of iterative reconstruction technique. CONTRAST:  52mL OMNIPAQUE IOHEXOL 350 MG/ML SOLN COMPARISON:  None. FINDINGS: CT HEAD FINDINGS Brain: No acute hemorrhage. Multiple old cerebellar infarcts. There is generalized atrophy without lobar predilection. Old left occipital infarct and findings of chronic small vessel ischemia. Skull: The visualized skull base, calvarium and extracranial soft tissues are normal. Sinuses/Orbits: No fluid levels or advanced mucosal thickening of the visualized paranasal sinuses. No mastoid or middle ear effusion. The orbits are normal. CTA NECK FINDINGS SKELETON: There is no bony spinal canal stenosis. No lytic or blastic lesion. OTHER NECK: Normal pharynx, larynx and major salivary glands. No cervical lymphadenopathy. Unremarkable thyroid gland. UPPER CHEST: No pneumothorax or pleural effusion. No nodules or masses. AORTIC ARCH: There is no  calcific atherosclerosis of the aortic arch. There is no aneurysm, dissection or hemodynamically significant stenosis of the visualized portion of the aorta. Conventional 3 vessel aortic branching pattern. The visualized proximal subclavian arteries are widely patent. RIGHT CAROTID SYSTEM: No dissection, occlusion or aneurysm. Mild atherosclerotic  calcification at the carotid bifurcation without hemodynamically significant stenosis. LEFT CAROTID SYSTEM: No dissection, occlusion or aneurysm. Mild atherosclerotic calcification at the carotid bifurcation without hemodynamically significant stenosis. VERTEBRAL ARTERIES: Right dominant configuration. Both origins are clearly patent. Multifocal atherosclerotic irregularity of the left vertebral artery. There is occlusion of the proximal left V4 segment with distal reconstitution. CTA HEAD FINDINGS POSTERIOR CIRCULATION: --Vertebral arteries: Occlusion versus short segment severe stenosis of the distal right V4 segment. --Inferior cerebellar arteries: Normal. --Basilar artery: Basilar artery is diffusely diminutive. --Superior cerebellar arteries: Normal. --Posterior cerebral arteries (PCA): Multifocal severe stenoses of both posterior cerebral arteries. ANTERIOR CIRCULATION: --Intracranial internal carotid arteries: Atherosclerotic calcification with moderate narrowing bilaterally. --Anterior cerebral arteries (ACA): Normal. Both A1 segments are present. Patent anterior communicating artery (a-comm). --Middle cerebral arteries (MCA): Multifocal atherosclerotic irregularity without occlusion or high-grade stenosis. VENOUS SINUSES: As permitted by contrast timing, patent. ANATOMIC VARIANTS: None Review of the MIP images confirms the above findings. IMPRESSION: 1. No acute intracranial hemorrhage or mass effect. 2. 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 3. No occlusion or hemodynamically significant  stenosis in the neck. Electronically Signed   By: Ulyses Jarred M.D.   On: 07/30/2021 21:30   CT Lumbar Spine Wo Contrast  Result Date: 07/30/2021 CLINICAL DATA:  Low back pain EXAM: CT LUMBAR SPINE WITHOUT CONTRAST TECHNIQUE: Multidetector CT imaging of the lumbar spine was performed without intravenous contrast administration. Multiplanar CT image reconstructions were also generated. RADIATION DOSE REDUCTION: This exam was performed according to the departmental dose-optimization program which includes automated exposure control, adjustment of the mA and/or kV according to patient size and/or use of iterative reconstruction technique. COMPARISON:  CT 07/17/2021 FINDINGS: Segmentation: 5 lumbar type vertebrae. Alignment: Normal. Vertebrae: No acute fracture or focal pathologic process. Paraspinal and other soft tissues: Aortic atherosclerosis. No paravertebral or paraspinal soft tissue abnormality. Disc levels: At T12-L1, maintained disc space. No canal stenosis. The foramen are patent bilaterally. At L1-L2, maintained disc space. No canal stenosis. Mild bilateral foraminal narrowing. At L2-L3, disc space narrowing and below. Hypertrophic facet degenerative changes. Diffuse disc bulge with mild canal stenosis. Right greater than left foraminal narrowing. At L3-L4, disc space narrowing and vacuum disc. Diffuse disc bulge with moderate canal stenosis. Moderate facet degenerative changes bilaterally. Moderate bilateral foraminal narrowing. At L4-L5, disc space narrowing and vacuum disc. Moderate canal stenosis. Hypertrophic facet degenerative changes. Moderate bilateral foraminal stenosis. At L5-S1, disc space narrowing. Diffuse disc bulge. No canal stenosis. Advanced hypertrophic facet degenerative changes. Moderate bilateral foraminal narrowing. IMPRESSION: 1. No acute osseous abnormality. 2. 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 Multilevel  foraminal stenosis L2-L3 through L5-S1. Electronically Signed   By: Donavan Foil M.D.   On: 07/30/2021 22:12    Procedures Procedures    Medications Ordered in ED Medications  enoxaparin (LOVENOX) injection 40 mg (has no administration in time range)  sodium chloride 0.9 % bolus 1,000 mL (0 mLs Intravenous Stopped 07/30/21 2142)  morphine 2 MG/ML injection 2 mg (2 mg Intravenous Given 07/30/21 1728)  iohexol (OMNIPAQUE) 350 MG/ML injection 75 mL (75 mLs Intravenous Contrast Given 07/30/21 2109)    ED Course/ Medical Decision Making/ A&P Clinical Course as of 07/31/21 0123  Thu Jul 30, 2021  2240 Admit, kirkpatrick to consult [CP]    Clinical Course User Index [CP] Anselmo Pickler, PA-C  Medical Decision Making Amount and/or Complexity of Data Reviewed Labs: ordered. Radiology: ordered.  Risk Prescription drug management. Decision regarding hospitalization.   This patient presents to the ED for concern of progressive weakness left greater than right, urinary retention, confusion, decreased ADLs, this involves an extensive number of treatment options, and is a complaint that carries with it a high risk of complications and morbidity. The emergent differential diagnosis includes, but is not limited to, acute stroke on chronic stroke, other intracranial abnormality, spinal cord abnormality, compression fracture of the lumbar spine, infection of the lumbar spine versus other.  This is not an exhaustive differential..   Co morbidities that complicate the patient evaluation: Obesity, known prior cerebellar infarct peripheral neuropathy, previous ACS, stents in place, pacemaker in place, A. fib with anticoagulation, however patient is normally noncompliant with his medication.  Social determinants of health: Patient is normally noncompliant with medication last being monitored closely by his daughter  Additional history obtained from patient's daughter,  son-in-law. External records from outside source obtained and reviewed including recent urgent care visits, and office visits over the last 2 months.  Physical Exam: Physical exam performed. The pertinent findings include: Patient with tenderness palpation of the left lumbar spine, with no step-off or deformity noted.  He has a decreased strength of the left leg compared to the right.  Is no facial droop, or other significant neurologic deficits.  He has a Foley catheter in place with no signs of action.  Lab Tests: I Ordered, and personally interpreted labs.  The pertinent results include: Slightly elevated white blood cell count of 14.2, BMP with hyponatremia, sodium 127,.  RVP negative.  Normal APTT.    Imaging Studies: I ordered imaging studies including CT lumbar spine, CT angio head and neck. I independently visualized and interpreted imaging which showed significant stenosis of the posterior cerebellar circulation, without evidence of acute infarct, however as patient has pacemaker in place obtain MRI at this time. I agree with the radiologist interpretation.   Cardiac Monitoring:  The patient was maintained on a cardiac monitor.  My attending physician Dr. Langston Masker viewed and interpreted the cardiac monitored which showed an underlying rhythm of: Atrial fibrillation with unclear age of old anteroseptal infarct   Medications: I ordered medication including normal saline bolus for hyponatremia, morphine for back pain. Reevaluation of the patient after these medicines showed that the patient improved. I have reviewed the patients home medicines and have made adjustments as needed.  Consultations Obtained: I requested consultation with the hospitalist, Dr. Andria Frames, as well as neurologist Dr. Leonel Ramsay,  and discussed lab and imaging findings as well as pertinent plan - they recommend: Admission to the hospital for further evaluation of neurologic compromise, work-up with MRI with pacemaker  precautions, continued support for patient's decreased ability to walk, decreased ADLs, ongoing urinary retention. Patient admitted to the hospital at this time, informed of plan.  Final Clinical Impression(s) / ED Diagnoses Final diagnoses:  Weakness    Rx / DC Orders ED Discharge Orders     None         Dorien Chihuahua 07/31/21 0123    Wyvonnia Dusky, MD 07/31/21 1013

## 2021-07-31 ENCOUNTER — Observation Stay (HOSPITAL_COMMUNITY): Payer: Medicare Other

## 2021-07-31 DIAGNOSIS — L8990 Pressure ulcer of unspecified site, unspecified stage: Secondary | ICD-10-CM | POA: Insufficient documentation

## 2021-07-31 DIAGNOSIS — R296 Repeated falls: Secondary | ICD-10-CM

## 2021-07-31 DIAGNOSIS — I2510 Atherosclerotic heart disease of native coronary artery without angina pectoris: Secondary | ICD-10-CM

## 2021-07-31 DIAGNOSIS — R531 Weakness: Secondary | ICD-10-CM

## 2021-07-31 DIAGNOSIS — I6990 Unspecified sequelae of unspecified cerebrovascular disease: Secondary | ICD-10-CM | POA: Insufficient documentation

## 2021-07-31 DIAGNOSIS — I4821 Permanent atrial fibrillation: Secondary | ICD-10-CM | POA: Diagnosis not present

## 2021-07-31 DIAGNOSIS — M5416 Radiculopathy, lumbar region: Secondary | ICD-10-CM | POA: Insufficient documentation

## 2021-07-31 DIAGNOSIS — Z950 Presence of cardiac pacemaker: Secondary | ICD-10-CM | POA: Insufficient documentation

## 2021-07-31 DIAGNOSIS — I679 Cerebrovascular disease, unspecified: Secondary | ICD-10-CM

## 2021-07-31 LAB — BASIC METABOLIC PANEL
Anion gap: 9 (ref 5–15)
BUN: 12 mg/dL (ref 8–23)
CO2: 27 mmol/L (ref 22–32)
Calcium: 9 mg/dL (ref 8.9–10.3)
Chloride: 93 mmol/L — ABNORMAL LOW (ref 98–111)
Creatinine, Ser: 0.73 mg/dL (ref 0.61–1.24)
GFR, Estimated: >60 mL/min (ref >60)
Glucose, Bld: 137 mg/dL — ABNORMAL HIGH (ref 70–99)
Potassium: 4 mmol/L (ref 3.5–5.1)
Sodium: 129 mmol/L — ABNORMAL LOW (ref 135–145)

## 2021-07-31 LAB — HEMOGLOBIN A1C
Hgb A1c MFr Bld: 6.2 % — ABNORMAL HIGH (ref 4.8–5.6)
Mean Plasma Glucose: 131.24 mg/dL

## 2021-07-31 LAB — CBC
HCT: 40.7 % (ref 39.0–52.0)
Hemoglobin: 13.8 g/dL (ref 13.0–17.0)
MCH: 29.1 pg (ref 26.0–34.0)
MCHC: 33.9 g/dL (ref 30.0–36.0)
MCV: 85.7 fL (ref 80.0–100.0)
Platelets: 338 10*3/uL (ref 150–400)
RBC: 4.75 MIL/uL (ref 4.22–5.81)
RDW: 13.4 % (ref 11.5–15.5)
WBC: 15.7 10*3/uL — ABNORMAL HIGH (ref 4.0–10.5)
nRBC: 0 % (ref 0.0–0.2)

## 2021-07-31 LAB — TSH: TSH: 1.281 u[IU]/mL (ref 0.350–4.500)

## 2021-07-31 LAB — LIPID PANEL
Cholesterol: 118 mg/dL (ref 0–200)
HDL: 60 mg/dL (ref >40)
LDL Cholesterol: 49 mg/dL (ref 0–99)
Total CHOL/HDL Ratio: 2 RATIO
Triglycerides: 45 mg/dL (ref <150)
VLDL: 9 mg/dL (ref 0–40)

## 2021-07-31 LAB — CREATININE, SERUM
Creatinine, Ser: 0.74 mg/dL (ref 0.61–1.24)
GFR, Estimated: >60 mL/min (ref >60)

## 2021-07-31 LAB — URINALYSIS, ROUTINE W REFLEX MICROSCOPIC
Bacteria, UA: NONE SEEN
Bilirubin Urine: NEGATIVE
Glucose, UA: NEGATIVE mg/dL
Ketones, ur: 5 mg/dL — AB
Leukocytes,Ua: NEGATIVE
Nitrite: NEGATIVE
Protein, ur: NEGATIVE mg/dL
RBC / HPF: >50 RBC/hpf — ABNORMAL HIGH (ref 0–5)
Specific Gravity, Urine: 1.02 (ref 1.005–1.030)
pH: 7 (ref 5.0–8.0)

## 2021-07-31 LAB — RAPID URINE DRUG SCREEN, HOSP PERFORMED
Amphetamines: NOT DETECTED
Barbiturates: NOT DETECTED
Benzodiazepines: NOT DETECTED
Cocaine: NOT DETECTED
Opiates: POSITIVE — AB
Tetrahydrocannabinol: NOT DETECTED

## 2021-07-31 IMAGING — CR DG CHEST 1V
1 series · 1 of 1 positions shown · non-contrast
Comparison: [DATE]

CLINICAL DATA: Left leg weakness since [OG] hours yesterday

EXAM:
CHEST  1 VIEW

[chest ap]
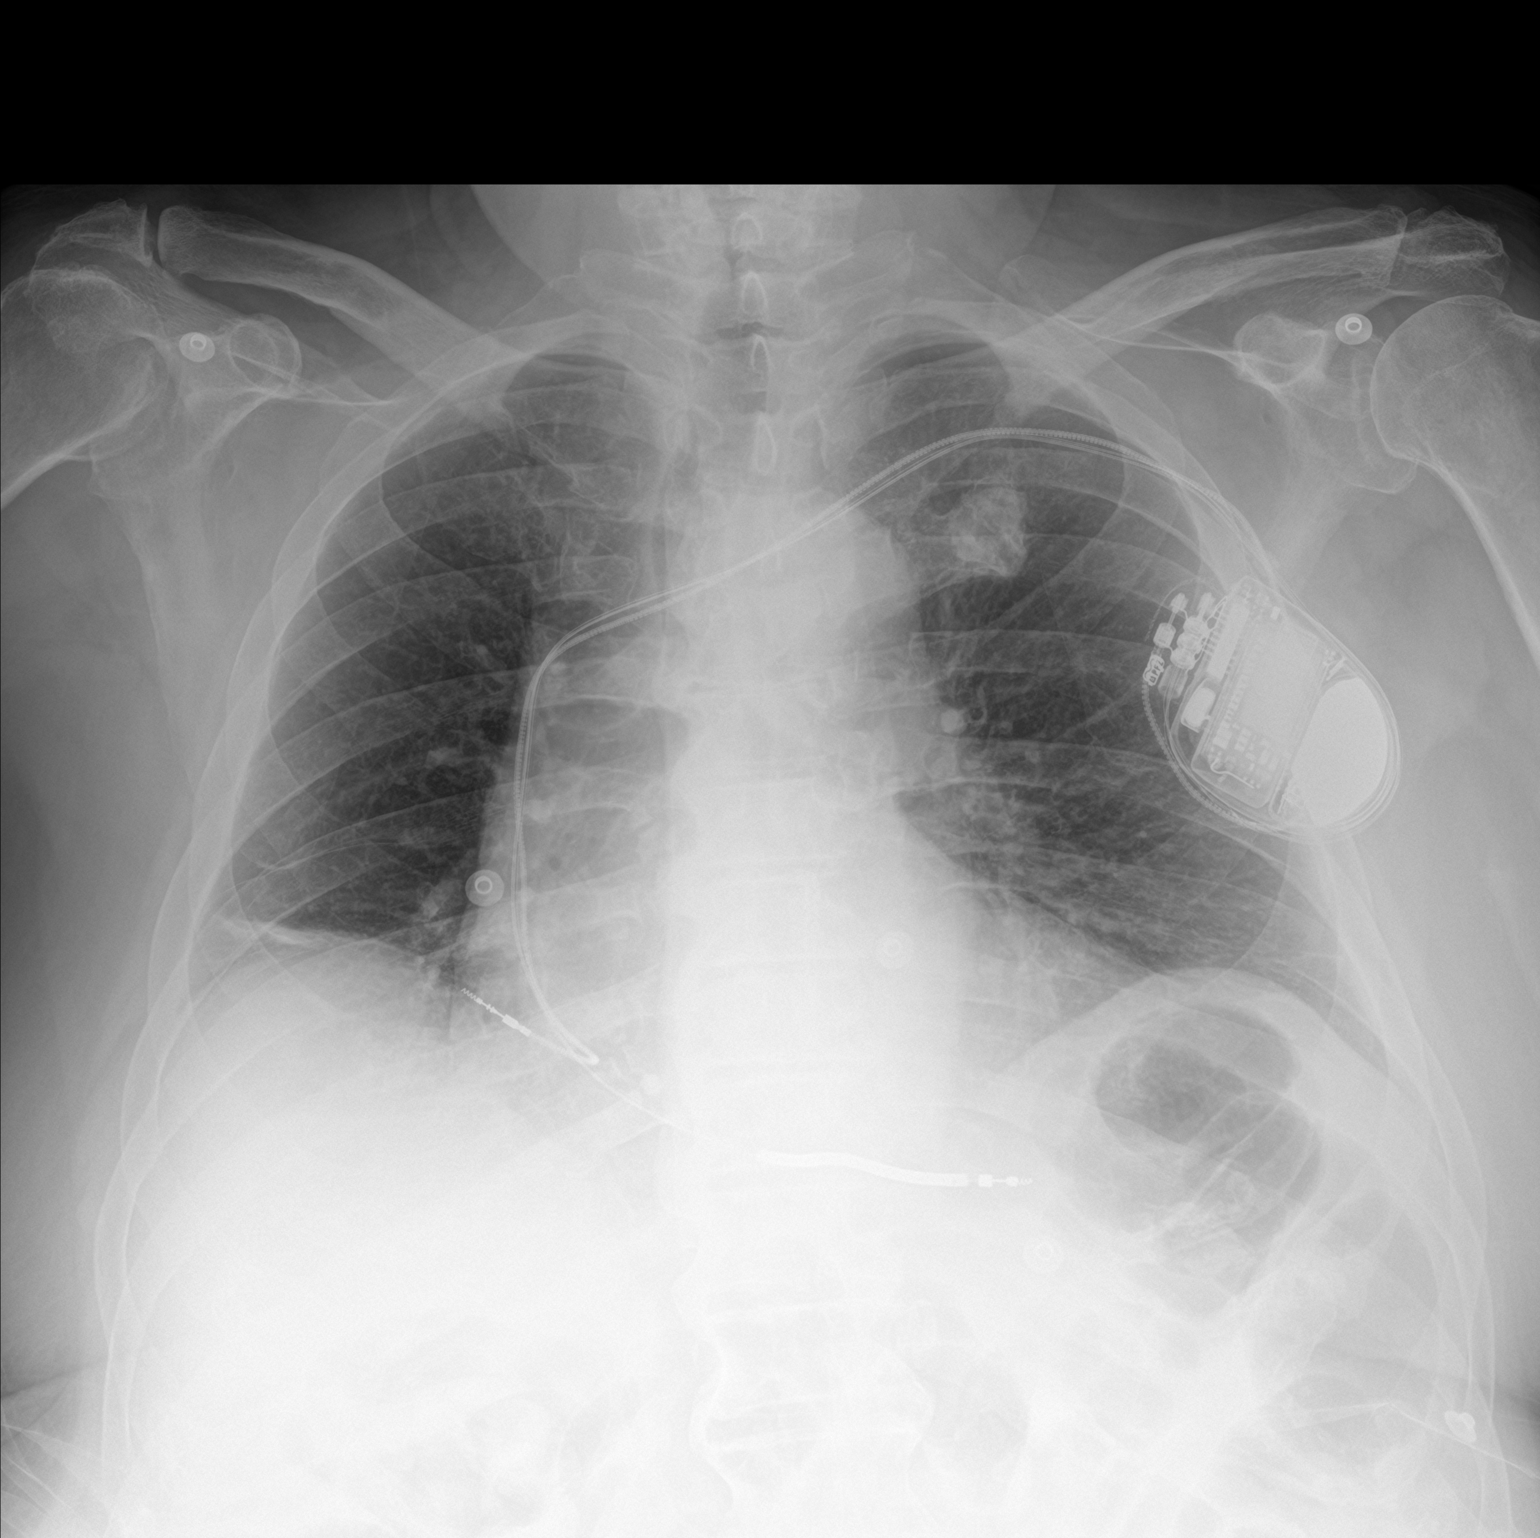

[1 of 1 positions shown; findings below may reference images not displayed]

FINDINGS: Right basilar atelectasis. No focal consolidation. No pleural
effusion or pneumothorax. Heart and mediastinal contours are
unremarkable. Dual lead cardiac pacemaker.

No acute osseous abnormality.
IMPRESSION: No acute cardiopulmonary disease.

## 2021-07-31 MED ORDER — ENOXAPARIN SODIUM 40 MG/0.4ML IJ SOSY: 40.0000 mg | PREFILLED_SYRINGE | Freq: Every day | INTRAMUSCULAR | Status: DC

## 2021-07-31 MED ORDER — ATORVASTATIN CALCIUM 40 MG PO TABS
40.0000 mg | ORAL_TABLET | Freq: Every day | ORAL | Status: DC
  Administered 2021-07-31 – 2021-08-13 (×14): 40 mg via ORAL
  Filled 2021-07-31 (×14): qty 1

## 2021-07-31 MED ORDER — ASPIRIN EC 81 MG PO TBEC
81.0000 mg | DELAYED_RELEASE_TABLET | Freq: Every day | ORAL | Status: DC
  Administered 2021-07-31 – 2021-08-05 (×6): 81 mg via ORAL
  Filled 2021-07-31 (×6): qty 1

## 2021-07-31 MED ORDER — TAMSULOSIN HCL 0.4 MG PO CAPS
0.4000 mg | ORAL_CAPSULE | Freq: Every day | ORAL | Status: DC
  Administered 2021-07-31 – 2021-08-20 (×19): 0.4 mg via ORAL
  Filled 2021-07-31 (×19): qty 1

## 2021-07-31 MED ORDER — APIXABAN 5 MG PO TABS
5.0000 mg | ORAL_TABLET | Freq: Two times a day (BID) | ORAL | Status: DC
  Administered 2021-07-31 – 2021-08-03 (×7): 5 mg via ORAL
  Filled 2021-07-31 (×7): qty 1

## 2021-07-31 MED ORDER — DILTIAZEM HCL ER COATED BEADS 120 MG PO CP24
120.0000 mg | ORAL_CAPSULE | Freq: Every day | ORAL | Status: DC
  Administered 2021-07-31 – 2021-08-20 (×21): 120 mg via ORAL
  Filled 2021-07-31 (×22): qty 1

## 2021-07-31 NOTE — Evaluation (Signed)
Physical Therapy Treatment Patient Details Name: Ronald Sheppard MRN: [id number] DOB: [birth year] Today's Date: 07/31/2021   History of Present Illness Pt is 81 yo male admitted on 07/30/21.  Notes and report vary on reason for admission.  Per H and P pt with falls at home after recent hospitalization for urinary retention.  However, per family and neurology - pt admitted to [facility]  last week for CVA and was sent to rehab where he developed further back pain, L sided weakness, and slurred speech so sent back to hospital. 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 CT lumbar spine multilevel lumbar foraminal stenosis.  Pt with hx including MI with AICD, CAD s/p stent, cerebellar CVA, HTN, HLD, PAF, BPH, and recent CVA per family    PT Comments    Pt admitted with above diagnosis. At baseline, pt lived alone and could ambulate in community; however, last week pt with CVA admitted to [facility] then SNF at d/c - daughter reports working with therapy in standing frame but very weak quads, further slurred speech, and back pain, so sent back to hospital.  Today, pt requiring mod x 2 to roll with max cues.  Unable to sit EOB due to fatigue and pain, also limited due to in ED on stretcher.  At this time , recommend return to SNF at d/c. Pt currently with functional limitations due to the deficits listed below (see PT Problem List). Pt will benefit from skilled PT to increase their independence and safety with mobility to allow discharge to the venue listed below.      Recommendations for follow up therapy are one component of a multi-disciplinary discharge planning process, led by the attending physician.  Recommendations may be updated based on patient status, additional functional criteria and insurance authorization.  Follow Up Recommendations  Skilled nursing-short term rehab (<3 hours/day)     Assistance Recommended at Discharge Frequent  or constant Supervision/Assistance  Patient can return home with the following Two people to help with walking and/or transfers;Two people to help with bathing/dressing/bathroom   Equipment Recommendations  Other (comment) (defer to post acute)    Recommendations for Other Services       Precautions / Restrictions Precautions Precautions: Fall     Mobility  Bed Mobility Overal bed mobility: Needs Assistance Bed Mobility: Rolling Rolling: Mod assist, +2 for physical assistance         General bed mobility comments: Max cues to reach across to rail with mod A of 2 to roll and assist legs.  Rolled both sides x 2 for cleaning.  Max x 2 to scoot up in bed    Transfers                   General transfer comment: unable today - limited due to pain and in ED on stretcher, fatigued after extensive cleanining    Ambulation/Gait                   Stairs             Wheelchair Mobility    Modified Rankin (Stroke Patients Only) Modified Rankin (Stroke Patients Only) Pre-Morbid Rankin Score: Slight disability Modified Rankin: Severe disability     Balance       Sitting balance - Comments: deferred  Cognition Arousal/Alertness: Awake/alert Behavior During Therapy: WFL for tasks assessed/performed Overall Cognitive Status: Impaired/Different from baseline Area of Impairment: Awareness, Problem solving, Memory                     Memory: Decreased short-term memory     Awareness: Emergent Problem Solving: Slow processing, Difficulty sequencing, Requires verbal cues General Comments: pt unaware of incontience.        Exercises      General Comments General comments (skin integrity, edema, etc.): Pt with wound on sacrum - notified nursing staff.  Pt had loose BM that required extensive cleaning during session      Pertinent Vitals/Pain Pain Assessment Pain Assessment: Faces Faces  Pain Scale: Hurts whole lot Pain Location: Buttock (raw, open wound) with cleaning; reports back pain with rolling (only in back, does not radiate) Pain Descriptors / Indicators: Grimacing, Moaning Pain Intervention(s): Limited activity within patient's tolerance, Monitored during session    Home Living Family/patient expects to be discharged to:: Skilled nursing facility Living Arrangements: Alone Available Help at Discharge: Family;Available PRN/intermittently Type of Home: House Home Access: Stairs to enter Entrance Stairs-Rails: Can reach both Entrance Stairs-Number of Steps: 6-8   Home Layout: Multi-level;Able to live on main level with bedroom/bathroom;Full bath on main level Home Equipment: Rolling Walker (2 wheels);Cane - single point;Shower seat      Prior Function            PT Goals (current goals can now be found in the care plan section) Acute Rehab PT Goals Patient Stated Goal: decrease pain ; daughter in agreement with return to SNF PT Goal Formulation: With patient/family Time For Goal Achievement: 08/14/21 Potential to Achieve Goals: Fair    Frequency    Min 3X/week      PT Plan      Co-evaluation PT/OT/SLP Co-Evaluation/Treatment: Yes Reason for Co-Treatment: Complexity of the patient's impairments (multi-system involvement);For patient/therapist safety PT goals addressed during session: Mobility/safety with mobility OT goals addressed during session: ADL's and self-care      AM-PAC PT "6 Clicks" Mobility   Outcome Measure  Help needed turning from your back to your side while in a flat bed without using bedrails?: Total Help needed moving from lying on your back to sitting on the side of a flat bed without using bedrails?: Total Help needed moving to and from a bed to a chair (including a wheelchair)?: Total Help needed standing up from a chair using your arms (e.g., wheelchair or bedside chair)?: Total Help needed to walk in hospital room?:  Total Help needed climbing 3-5 steps with a railing? : Total 6 Click Score: 6    End of Session   Activity Tolerance: Patient limited by pain;Patient limited by fatigue Patient left: in bed;with call bell/phone within reach;with family/visitor present (in ED) Nurse Communication: Mobility status PT Visit Diagnosis: Other abnormalities of gait and mobility (R26.89);Hemiplegia and hemiparesis;Muscle weakness (generalized) (M62.81)     Time: 8413-2440 PT Time Calculation (min) (ACUTE ONLY): 37 min  Charges:                        Abran Richard, PT Acute Rehab Services Pager [phone] [facility] [place] 07/31/2021, 12:12 PM

## 2021-07-31 NOTE — Evaluation (Signed)
Occupational Therapy Evaluation Patient Details Name: Ronald Sheppard MRN: [id number] DOB: May 29, 1941 Today's Date: 07/31/2021   History of Present Illness Pt is 81 yo male admitted on 07/30/21.  Notes and report vary on reason for admission.  Per H and P pt with falls at home after recent hospitalization for urinary retention.  However, per family and neurology - pt admitted to [facility]  last week for CVA and was sent to rehab where he developed further back pain, L sided weakness, and slurred speech so sent back to hospital. 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 CT lumbar spine multilevel lumbar foraminal stenosis.  Pt with hx including MI with AICD, CAD s/p stent, cerebellar CVA, HTN, HLD, PAF, BPH, and recent CVA per family   Clinical Impression   Pt admitted for above and limited by problem list below, including pain, weakness, activity tolerance, and cognition.  Today, patient incontinent of bowels with no awareness, requires total assist +2 for toileting, mod assist +2 for rolling in bed and min-total assist for bathing/dressing at bed level.  Daughter reports prior to admission in [place] last week, patient was independent, driving and lived alone; since he was dc'd to SNF he has needed increased assist.  He was oriented and follows commands, but demonstrates poor awareness, problem solving and slow processing.  Believe he will benefit from continued OT services while admitted and after dc at SNF level to optimize independence, safety with ADLs and decrease burden of care. Will follow.      Recommendations for follow up therapy are one component of a multi-disciplinary discharge planning process, led by the attending physician.  Recommendations may be updated based on patient status, additional functional criteria and insurance authorization.   Follow Up Recommendations  Skilled nursing-short term rehab (<3 hours/day)     Assistance Recommended at Discharge Frequent or constant Supervision/Assistance  Patient can return home with the following Two people to help with walking and/or transfers;A lot of help with bathing/dressing/bathroom;Assistance with cooking/housework;Direct supervision/assist for medications management;Direct supervision/assist for financial management;Assist for transportation;Help with stairs or ramp for entrance    Functional Status Assessment  Patient has had a recent decline in their functional status and demonstrates the ability to make significant improvements in function in a reasonable and predictable amount of time.  Equipment Recommendations  Other (comment) (TBD)    Recommendations for Other Services       Precautions / Restrictions Precautions Precautions: Fall Restrictions Weight Bearing Restrictions: No      Mobility Bed Mobility Overal bed mobility: Needs Assistance Bed Mobility: Rolling Rolling: Mod assist, +2 for physical assistance         General bed mobility comments: Max cues to reach across to rail with mod A of 2 to roll and assist legs.  Rolled both sides x 2 for cleaning.  Max x 2 to scoot up in bed    Transfers                   General transfer comment: unable today - limited due to pain and in ED on stretcher, fatigued after extensive cleanining      Balance                                           ADL either performed or assessed with clinical judgement  ADL Overall ADL's : Needs assistance/impaired     Grooming: Minimal assistance;Bed level   Upper Body Bathing: Minimal assistance;Bed level   Lower Body Bathing: Total assistance;+2 for physical assistance;+2 for safety/equipment;Bed level   Upper Body Dressing : Bed level;Moderate assistance   Lower Body Dressing: +2 for physical assistance;Total assistance;+2 for safety/equipment;Bed level       Toileting- Clothing Manipulation and Hygiene: Total  assistance;+2 for physical assistance;+2 for safety/equipment;Bed level       Functional mobility during ADLs: Moderate assistance;+2 for physical assistance;+2 for safety/equipment       Vision   Vision Assessment?: No apparent visual deficits     Perception     Praxis      Pertinent Vitals/Pain Pain Assessment Pain Assessment: Faces Faces Pain Scale: Hurts whole lot Pain Location: Buttock (raw, open wound) with cleaning; reports back pain with rolling (only in back, does not radiate) Pain Descriptors / Indicators: Grimacing, Moaning Pain Intervention(s): Limited activity within patient's tolerance, Monitored during session, Patient requesting pain meds-RN notified     Hand Dominance Right   Extremity/Trunk Assessment Upper Extremity Assessment Upper Extremity Assessment: Generalized weakness   Lower Extremity Assessment Lower Extremity Assessment: Defer to PT evaluation RLE Deficits / Details: ROM WFL; MMT in supine: ankle 4/5, hip and knee 3/5 RLE Sensation: decreased light touch LLE Deficits / Details: ROM WFL; MMT in supine: ankle 0/5, knee 1/5, hip 2/5 LLE Sensation: decreased light touch   Cervical / Trunk Assessment Cervical / Trunk Assessment: Other exceptions Cervical / Trunk Exceptions: back pain   Communication Communication Communication: HOH   Cognition Arousal/Alertness: Awake/alert Behavior During Therapy: WFL for tasks assessed/performed Overall Cognitive Status: Impaired/Different from baseline Area of Impairment: Awareness, Problem solving, Memory                     Memory: Decreased short-term memory     Awareness: Emergent Problem Solving: Slow processing, Difficulty sequencing, Requires verbal cues General Comments: pt unaware of incontience. follows simple commands but poor problem solving and slow processing     General Comments  Pt with wound on sacrum - notified nursing staff.  Pt had loose BM that required extensive  cleaning during session    Exercises     Shoulder Instructions      Home Living Family/patient expects to be discharged to:: Skilled nursing facility Living Arrangements: Alone Available Help at Discharge: Family;Available PRN/intermittently Type of Home: House Home Access: Stairs to enter [employer] of Steps: 6-8 Entrance Stairs-Rails: Can reach both Home Layout: Multi-level;Able to live on main level with bedroom/bathroom;Full bath on main level     Bathroom Shower/Tub: Walk-in shower     Bathroom Accessibility: No   Home Equipment: Conservation officer, nature (2 wheels);Cane - single point;Shower seat          Prior Functioning/Environment Prior Level of Function : Driving;Independent/Modified Independent             Mobility Comments: occasionally used cane, could ambulate in community ; had CVA 1-2 weeks ago and went SNF - not back to walking, working on standing in standing frame and hoyer to chair ADLs Comments: independent with ADLs; basic IADLs ;        OT Problem List: Decreased strength;Decreased activity tolerance;Impaired balance (sitting and/or standing);Decreased cognition;Decreased safety awareness;Decreased knowledge of use of DME or AE;Decreased knowledge of precautions;Obesity;Pain      OT Treatment/Interventions: Self-care/ADL training;Therapeutic exercise;DME and/or AE instruction;Therapeutic activities;Cognitive remediation/compensation;Patient/family education;Balance training    OT Goals(Current goals  can be found in the care plan section) Acute Rehab OT Goals Patient Stated Goal: less pain OT Goal Formulation: With patient Time For Goal Achievement: 08/14/21 Potential to Achieve Goals: Good  OT Frequency: Min 2X/week    Co-evaluation PT/OT/SLP Co-Evaluation/Treatment: Yes Reason for Co-Treatment: Complexity of the patient's impairments (multi-system involvement);For patient/therapist safety PT goals addressed during session:  Mobility/safety with mobility OT goals addressed during session: ADL's and self-care      AM-PAC OT "6 Clicks" Daily Activity     Outcome Measure Help from another person eating meals?: A Little Help from another person taking care of personal grooming?: A Little Help from another person toileting, which includes using toliet, bedpan, or urinal?: Total Help from another person bathing (including washing, rinsing, drying)?: A Lot Help from another person to put on and taking off regular upper body clothing?: A Lot Help from another person to put on and taking off regular lower body clothing?: Total 6 Click Score: 12   End of Session Nurse Communication: Mobility status;Other (comment) (wound on sacrum)  Activity Tolerance: Patient tolerated treatment well Patient left: in bed;with call bell/phone within reach;with family/visitor present  OT Visit Diagnosis: Other abnormalities of gait and mobility (R26.89);Muscle weakness (generalized) (M62.81);Pain;Other symptoms and signs involving cognitive function Pain - Right/Left: Left Pain - part of body: Leg (back)                Time: 1275-1700 OT Time Calculation (min): 38 min Charges:  OT General Charges $OT Visit: 1 Visit OT Evaluation $OT Eval Moderate Complexity: 1 Mod  Jolaine Artist, OT Acute Rehabilitation Services Pager [phone] Office [phone]   Delight Stare 07/31/2021, 1:02 PM

## 2021-07-31 NOTE — Hospital Course (Addendum)
Ronald Sheppard is a 81 y.o. male presenting with fall. PMH is significant for MI w/ pacemake and defibrillator CAD s/p stent, cerebellar stroke, HTN, HLD, PAF, BPH. His hospital course is below, detailed by problem.     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 His daughter reported that patient has had multiple falls within the last couple of weeks and mostly attributed to imbalance. Patient was recently hospitalized a week ago at a Hospital in [place] for urinary retention. He was discharged with recommendations for outpatient PT/OT.  His vitals were stable on admission. initial labs significant for hyponatremia with Na 127, K4.1 and Cr 0.75. CBC showed elevated WBC of 14.2 likely due to acute stress.  Head CT showed no acute cranial hemorrhage but showed multifocal posterior cerebral artery stenosis.  Lumbar spine CT showed multilevel lumbar foraminal stenosis.  Given patient's imbalance with ambulation, back pain, recent hospitalization for urinary retention and lumbar spine CT finding is highly suspicious of spinal radiculopathy. Patient received MRI Brain, Thoracic spine, and Lumbar spine, that showed abnormal signal ranging from the thoracic spine to the cauda equina. Imaging was concerning for swelling/inflammation of the spinal cord. Neurology recommended CT chest/abd/pelvis and MRI brain w/ contrast. 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 MR Lumbar Spine showed abnormal signal within the conus with extensive abnormal enhancement in the conus, visualized lower thoracic cord, and the cauda equina nerve roots diffusely. CT abdomen showed no acute issues except marked enlargement of prostate suggesting prostatic hypertrophy or neoplasm. PSA was only  12.22. Neurology felt the lesions on the spine where indicative of a malignancy and were requesting an LP.  Fluoroscopic LP showed bloody CSF, over 18,000 RBC, WBC of 235, lymphs of 96, greater than 600 protein.  CSF culture was negative.  Cytology showed no malignant cells.  Neurooncology was consulted for recommendations and commended finding primary source of cancer with FDG-PET imaging, repeat lumbar punctures needing up to 3 CSF cultures to identify leptomeningeal disease. Michela Pitcher that this was most consistent with malignancy presentation.  Palliative team was consulted and goals of care conversation resulted in patient opting for full comfort care route as he did not want to continue testing.  Urinary retention   Lesion on glans Patient reports several weeks of difficulty urinating which she thought was related to his diet.  Reports that over the last few days he has felt like his abdomen is more full and painful.  Since having Foley placed and draining bladder he has had relief in his abdominal pain. Patient had foley placed for unknown extended time, that was changed during admission. During foley change a lesion was noted on the urethral surface of the glans, with pus. Urology was consulted and unsure of etiology of lesion, but determined that it did not need to be treated with antibiotics. Foley was continued and lesion was monitored during hospitalization. Urology recommended indwelling catheter given hospice route and that it should be exchanged every 4 weeks.  Sacral ulcer Unstageable wound to sacrum.  Eschar obscuring to wound per [place] nurse. Air mattress replacement was given for patient in hospital.  Nutrition was consulted for their recommendations.  General surgery consultation was deferred given patient's comfort care goals and after discussing with family and patient.   Medications were optimized to  meet comfort care/family and patient goals (atorvastatin, Eliquis Dc'd)  All other  conditions chronic and stable Hx of cerebellar stroke Hx of Myocardial infarction   CAD with stents Hx of PAF on Eliquis  HLD HTN  Issues for follow up: Catheter should be exchanged every 4 weeks at his facility Will need routine wound care for sacral ulcer

## 2021-07-31 NOTE — ED Notes (Signed)
Unable to obtain orthostatic VS on pt. Pt states that they are unable to stand and bear any weight. Attempted to sit pt up and pt unable to maintain sitting position. RN notified.

## 2021-07-31 NOTE — ED Notes (Signed)
Patient transported to X-ray 

## 2021-07-31 NOTE — Progress Notes (Addendum)
Family Medicine Teaching Service Daily Progress Note Intern Pager: [phone]  Patient name: Ronald Sheppard Medical record number: [id number] Date of birth: Oct 29, 1940 Age: 81 y.o. Gender: male  Primary Care Provider: Raina Mina., MD Consultants: Neurology Code Status: Full  Pt Overview and Major Events to Date:  07/30/21 - Patient admitted  Assessment and Plan:  Ronald Sheppard is a 81 y.o. male presenting with fall. PMH is significant for MI w/ pacemake and defibrillator CAD s/p stent, cerebellar stroke, HTN, HLD, PAF, BPH     Fall   Subacute Stroke Patient is several days out form stroke, neuro recommend continuing Eliquis. TSH normal, Hgb A1c is 6.2 patient is prediabetic, Lipids: LDL 165, T. Chol 238, HDL 50  CT Lumbar showed, no acute abnormality with moderate canal stenosis at L3-L4 and L4-L5. Multilevel foraminal stenosis L2-L3 through L5-S1.  CT Angio head: No acute intracranial hemorrhage or mass effect. 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 No occlusion or hemodynamically significant stenosis in the neck  -F/u MRI head -Apixaban 5 mg BID -F/u echo -PT/OT -Stroke team following, appreciate recs  Urinary retention Patient had been having urinary hesitancy and was retaining for last few days. Foley catheter placed, UOP 1700. Will try voiding trial tomorrow.  -Flomax 0.4mg  daily -voiding trial tomorrow  Hyponatremia Na 127 on admission -Afternoon BMP -Continue to monitor  Hx of cerebellar stroke Stroke in 2017 with defects in balance, and vision -Fall precautions  Hx of Myocardial infarction   CAD with stents Stroke in 2020, hospitalization, stent in LAD and Cx, defibrillator placed -Continuous cardiac monitoring -ASA 81 mg daily  Hx of PAF on Eliquis  EKG showing A.fib. Patient reported to not have been taking eliquis for 6 weeks 2/2 to cost, per Neurology note -Continue diltiazem  120 mg daily -Continue Eliquis 5 mg BID  HLD -continue atorvastin 40 mg  HTN Home meds Cardizem 120 mg, BP range 100-160/60-90's -Continue to monitor  FEN/GI: Carb modified PPx: Eliquis Dispo:SNF in 2-3 days.   Subjective:  Patient in good spirits today, but says he's felt better.   Objective: Temp:  [97.5 F (36.4 C)-98.2 F (36.8 C)] 98 F (36.7 C) (01/27 0424) Pulse Rate:  [73-86] 86 (01/27 0744) Resp:  [16-20] 16 (01/27 0744) BP: (100-159)/(65-90) 159/90 (01/27 0744) SpO2:  [96 %-100 %] 99 % (01/27 0744) Physical Exam: General: Well appearing, polite, NAD, white male Cardiovascular: Distant heart sounds Respiratory: CTABL Abdomen: Soft, NTTP, non-distended Extremities: Moving all extremities independently Neuro: Strength 5/5 in  both UE and RLE, 2/5 in LLE. Sensation grossly intact. Cranial nerves intact grossly   Laboratory: Recent Labs  Lab 07/30/21 1236 07/31/21 0056  WBC 14.2* 15.7*  HGB 13.6 13.8  HCT 41.4 40.7  PLT 346 338   Recent Labs  Lab 07/30/21 1236 07/31/21 0056  NA 127*  --   K 4.1  --   CL 93*  --   CO2 25  --   BUN 12  --   CREATININE 0.75 0.74  CALCIUM 8.6*  --   GLUCOSE 121*  --       Imaging/Diagnostic Tests:   [name], MD 07/31/2021, 8:38 AM PGY-1, [facility] Intern pager: [phone], text pages welcome

## 2021-07-31 NOTE — Progress Notes (Signed)
Pt arrived to the unit from ED, pt bathed, IV flushed, skin assessed with a secondary nurse. Pt is confused, Pt is on Tele, and unable to answer admission questions at this time. Pt daughter is listed as emergency contact, unable to call at this time. Will notified AM nurse.

## 2021-07-31 NOTE — ED Notes (Addendum)
MD paged and made aware of hypertension. Pt to be repositioned and cuff changed.

## 2021-07-31 NOTE — Progress Notes (Signed)
Family Medicine Teaching Service Daily Progress Note Intern Pager: [phone]  Patient name: Ronald Sheppard Medical record number: [id number] Date of birth: 1941/03/09 Age: 81 y.o. Gender: male  Primary Care Provider: Raina Mina., MD Consultants: Neurology Code Status: Full  Pt Overview and Major Events to Date:  07/30/21 - Patient admitted  Assessment and Plan:  Ronald Sheppard is a 81 y.o. male presenting with fall. PMH is significant for MI w/ pacemake and defibrillator CAD s/p stent, cerebellar stroke, HTN, HLD, PAF, BPH   Fall   Subacute Stroke Patient is stable from previous evaluations.  Neurology was consulted who recommended continuing the Eliquis as well as restratification labs. -Stroke team following, appreciate their assistance - MRI brain, thoracic, lumbar spine ordered but not completed yet.  Follow-up on those imaging studies - Continue apixaban 5 mg twice daily - PT/OT eval and treat  Urinary retention Patient has Foley in place this time.  Plan for voiding trial today. - Continue Flomax 0.4 mg daily - Voiding trial today  Hyponatremia Sodium today pending.  129 yesterday. - Continue to monitor with daily BMPs   Hx of cerebellar stroke Patient has history of cerebellar stroke in 2017 with residual deficits in balance and vision. - Continue fall precautions  Hx of Myocardial infarction   CAD with stents MI in 2020 with hospitalization and stent placement LAD and CX.  Defibrillator placed. - Continuous cardiac monitoring - Continue ASA 81 mg daily  Hx of PAF on Eliquis  Patient in atrial fibrillation.  Had not been taking Eliquis for 6 weeks prior to admission due to cost. - Continue diltiazem 120 mg daily - Continue Eliquis 5 mg twice daily  HLD -continue atorvastin 40 mg  HTN Pressures over the last 24 hours have ranged from 128/80-190/95.  Most recent blood pressure was 135/91.Marland Kitchen  Home medication includes Cardizem 120 mg daily. - Continue to monitor  and consider initiation of home blood pressure medications  FEN/GI: Carb modified PPx: Eliquis Dispo:SNF in 2-3 days.   Subjective:  Patient reports he is doing well this morning.  Reports continued weakness in the lower extremities bilaterally.  No other complaints.  Objective: Temp:  [98 F (36.7 C)-98.1 F (36.7 C)] 98 F (36.7 C) (01/27 2215) Pulse Rate:  [43-90] 78 (01/27 2215) Resp:  [14-18] 16 (01/27 2215) BP: (128-192)/(78-152) 130/95 (01/27 2215) SpO2:  [94 %-100 %] 96 % (01/27 2215) Physical Exam: General: Well-appearing 81 year old male in no acute distress Cardiovascular: Distant heart sounds, regular rate Respiratory: Lungs are clear to auscultation bilaterally, normal work of breathing Abdomen: Soft, nontender, positive bowel sounds Extremities: Moving all extremities without difficulty Neuro: Strength is 5/5 in upper extremities bilaterally as well as left lower extremity.  3/5 in right lower extremity.  Sensation intact.  Cranial nerves grossly intact.  Laboratory: Recent Labs  Lab 07/30/21 1236 07/31/21 0056  WBC 14.2* 15.7*  HGB 13.6 13.8  HCT 41.4 40.7  PLT 346 338    Recent Labs  Lab 07/30/21 1236 07/31/21 0056 07/31/21 1247  NA 127*  --  129*  K 4.1  --  4.0  CL 93*  --  93*  CO2 25  --  27  BUN 12  --  12  CREATININE 0.75 0.74 0.73  CALCIUM 8.6*  --  9.0  GLUCOSE 121*  --  137*     Imaging/Diagnostic Tests:   Gifford Shave, MD 07/31/2021, 11:50 PM PGY-3, [place] Intern pager: [phone], text pages welcome

## 2021-07-31 NOTE — Consult Note (Signed)
Neurology Consultation Reason for Consult: Left-sided weakness Referring Physician: Quentin Mulling  CC: Left-sided weakness  History is obtained from: Patient  HPI: Ronald Sheppard is a 81 y.o. male who has been having left-sided weakness for the past several days.  He states that he also has been told by his daughter that he is having slurred speech, though he states that he does not really feel that that is true.  He has noticed a little bit of weakness in his left arm as well.   ROS: A 14 point ROS was performed and is negative except as noted in the HPI.   No past medical history on file.   No family history on file.   Social History:  reports that he has an unknown smoking status. He has never used smokeless tobacco. No history on file for alcohol use and drug use.   Exam: Current vital signs: BP 130/78 (BP Location: Right Arm)    Pulse 82    Temp 98 F (36.7 C) (Oral)    Resp 17    SpO2 99%  Vital signs in last 24 hours: Temp:  [97.5 F (36.4 C)-98.2 F (36.8 C)] 98 F (36.7 C) (01/27 0424) Pulse Rate:  [73-86] 82 (01/27 0424) Resp:  [17-20] 17 (01/27 0424) BP: (100-154)/(65-89) 130/78 (01/27 0424) SpO2:  [96 %-100 %] 99 % (01/27 0424)   Physical Exam  Constitutional: Appears well-developed and well-nourished.  Psych: Affect appropriate to situation Eyes: No scleral injection HENT: No OP obstruction MSK: no joint deformities.  Cardiovascular: Normal rate and regular rhythm.  Respiratory: Effort normal, non-labored breathing GI: Soft.  No distension. There is no tenderness.  Skin: WDI  Neuro: Mental Status: Patient is awake, alert, oriented to person, place, month, year, and situation. Patient is able to give a clear and coherent history. No signs of aphasia or neglect Cranial Nerves: II: Visual Fields are full. Pupils are equal, round, and reactive to light.   III,IV, VI: EOMI without ptosis or diploplia.  V: Facial sensation is symmetric  VII: Facial  movement with mild left facial weakness VIII: hearing is intact to voice X: Uvula elevates symmetrically XI: Shoulder shrug is symmetric. XII: tongue is midline without atrophy or fasciculations.  Motor: Tone is normal. Bulk is normal. 5/5 strength was present in the right arm and leg, he has 4+/5 strength in the left arm with mild drift, 2/5 weakness of the left leg Sensory: Sensation is diminished in the left leg Cerebellar: He has significantly more trouble with finger-nose-finger on the left than the right     I have reviewed labs in epic and the results pertinent to this consultation are: Creatinine 0.75  I have reviewed the images obtained: CTA-posterior circulation disease, but no clear acute infarct  Impression: 81 year old male with what I strongly suspect is an acute stroke.  He is being admitted for secondary risk factor modification and physical therapy.  He is already several days out from his stroke, and it is not a large one, so I think given that he has been continued on Eliquis so far, it would be reasonable to continue this.  Recommendations: 1) apixaban 5 mg twice daily 2) lipid panel, A1c 3) echocardiogram 4) PT, OT, ST 5) stroke team to follow    Roland Rack, MD [facility] [phone]  If 7pm- 7am, please page neurology on call as listed in [place].

## 2021-07-31 NOTE — Progress Notes (Addendum)
STROKE TEAM PROGRESS NOTE   ATTENDING NOTE: I reviewed above note and agree with the assessment and plan. Pt was seen and examined.   81 year old male with history of cardiac arrest w/ AICD, MI/CAD s/p stent, HTN, HLD, PAF noncompliant with eliquis admitted for left leg weakness for 10 days.  Patient was initially admitted to [facility] for left leg pain, left leg weakness causing falls.  Per daughter, patient stayed in [facility] for several days and discharged to SNF.  No etiology given for patient symptoms at the time.  In SNF, patient stated that his left leg weakness getting worse and he fell again in SNF, with a slurred speech and the patient was sent to [facility] for evaluation.  CT no acute abnormality, CT head and neck showed severe posterior circulation stenosis including right V4 occlusion, diminutive basilar artery and bilateral PCAs severe stenosis.  MRI brain, MRI T/L spine pending.  EF 41%, LDL 49 and A1c 6.2.  UDS positive for opiates.  Creatinine 0.74.  WBC 14.2-15.7.  On exam, patient hard of hearing, but awake alert, orientated to age, self, people and place.  No aphasia, mild dysarthria, fluent language, follows simple commands.  No visual field deficit, no gaze palsy, no facial droop.  Bilateral upper extremity strong symmetrical.  Right lower extremity 3+/5 proximal and 4/5 distal, left lower extremity 3-/5 proximal and 0/5 distal.  Sensation not corporative, but seems decreased on the left lower extremity.  Finger-to-nose intact.  Etiology for patient symptoms not quite clear.  Isolated left leg weakness and numbness can be left ACA stroke given patient A. fib not on AC.  However, patient did complain of back pain and leg pain but not as severe as radiculopathy enough to cause leg weakness.  Since patient has AICD placement, will do MRI brain, T/L-spine altogether at same time.  Start Eliquis 5 mg twice daily for stroke prevention, continue aspirin 81 and Lipitor 40 home  medication.  PT/OT recommend SNF.  We will follow.  For detailed assessment and plan, please refer to above as I have made changes wherever appropriate.   Rosalin Hawking, MD PhD Stroke Neurology 07/31/2021 7:09 PM  I spent extensive time in counseling and coordination of care, reviewing test results, images and medication, and discussing the diagnosis, treatment plan and potential prognosis. This patient's care requiresreview of multiple databases, neurological assessment, discussion with family, other specialists and medical decision making of high complexity. I had long discussion with daughter and patient at bedside, updated pt current condition, treatment plan and potential prognosis, and answered all the questions.  They expressed understanding and appreciation.        INTERVAL HISTORY His daughter is at the bedside.  Patient reports having leg and back pain for several weeks leading to patient falling multiple times last week. Patient endorses associated leg weakness when he fell. Patient reports going to [facility] for workup and being sent to SNF from there. Yesterday, patient was sent to hospital form SNF due to new onset dysarthria. Patient has not taken Eliquis for at least 6 weeks due to cost. Ordered brain MRI as well as Lumbar/Thoracic MRI to be done together as patient has ICD pacemaker. Eliquis restarted in hospital.   Vitals:   07/30/21 2359 07/31/21 0424 07/31/21 0742 07/31/21 0744  BP: (!) 154/84 130/78  (!) 159/90  Pulse: 83 82 77 86  Resp: 18 17  16   Temp:  98 F (36.7 C)    TempSrc:  Oral  SpO2: 96% 99% 99% 99%   CBC:  Recent Labs  Lab 07/30/21 1236 07/31/21 0056  WBC 14.2* 15.7*  HGB 13.6 13.8  HCT 41.4 40.7  MCV 86.3 85.7  PLT 346 027   Basic Metabolic Panel:  Recent Labs  Lab 07/30/21 1236 07/31/21 0056  NA 127*  --   K 4.1  --   CL 93*  --   CO2 25  --   GLUCOSE 121*  --   BUN 12  --   CREATININE 0.75 0.74  CALCIUM 8.6*  --    Lipid  Panel: No results for input(s): CHOL, TRIG, HDL, CHOLHDL, VLDL, LDLCALC in the last 168 hours. HgbA1c:  Recent Labs  Lab 07/31/21 0056  HGBA1C 6.2*   Urine Drug Screen: No results for input(s): LABOPIA, COCAINSCRNUR, LABBENZ, AMPHETMU, THCU, LABBARB in the last 168 hours.  Alcohol Level No results for input(s): ETH in the last 168 hours.  IMAGING past 24 hours CT ANGIO HEAD NECK W WO CM  Result Date: 07/30/2021 CLINICAL DATA:  Left lower extremity weakness EXAM: CT ANGIOGRAPHY HEAD AND NECK TECHNIQUE: Multidetector CT imaging of the head and neck was performed using the standard protocol during bolus administration of intravenous contrast. Multiplanar CT image reconstructions and MIPs were obtained to evaluate the vascular anatomy. Carotid stenosis measurements (when applicable) are obtained utilizing NASCET criteria, using the distal internal carotid diameter as the denominator. RADIATION DOSE REDUCTION: This exam was performed according to the departmental dose-optimization program which includes automated exposure control, adjustment of the mA and/or kV according to patient size and/or use of iterative reconstruction technique. CONTRAST:  71mL OMNIPAQUE IOHEXOL 350 MG/ML SOLN COMPARISON:  None. FINDINGS: CT HEAD FINDINGS Brain: No acute hemorrhage. Multiple old cerebellar infarcts. There is generalized atrophy without lobar predilection. Old left occipital infarct and findings of chronic small vessel ischemia. Skull: The visualized skull base, calvarium and extracranial soft tissues are normal. Sinuses/Orbits: No fluid levels or advanced mucosal thickening of the visualized paranasal sinuses. No mastoid or middle ear effusion. The orbits are normal. CTA NECK FINDINGS SKELETON: There is no bony spinal canal stenosis. No lytic or blastic lesion. OTHER NECK: Normal pharynx, larynx and major salivary glands. No cervical lymphadenopathy. Unremarkable thyroid gland. UPPER CHEST: No pneumothorax or pleural  effusion. No nodules or masses. AORTIC ARCH: There is no calcific atherosclerosis of the aortic arch. There is no aneurysm, dissection or hemodynamically significant stenosis of the visualized portion of the aorta. Conventional 3 vessel aortic branching pattern. The visualized proximal subclavian arteries are widely patent. RIGHT CAROTID SYSTEM: No dissection, occlusion or aneurysm. Mild atherosclerotic calcification at the carotid bifurcation without hemodynamically significant stenosis. LEFT CAROTID SYSTEM: No dissection, occlusion or aneurysm. Mild atherosclerotic calcification at the carotid bifurcation without hemodynamically significant stenosis. VERTEBRAL ARTERIES: Right dominant configuration. Both origins are clearly patent. Multifocal atherosclerotic irregularity of the left vertebral artery. There is occlusion of the proximal left V4 segment with distal reconstitution. CTA HEAD FINDINGS POSTERIOR CIRCULATION: --Vertebral arteries: Occlusion versus short segment severe stenosis of the distal right V4 segment. --Inferior cerebellar arteries: Normal. --Basilar artery: Basilar artery is diffusely diminutive. --Superior cerebellar arteries: Normal. --Posterior cerebral arteries (PCA): Multifocal severe stenoses of both posterior cerebral arteries. ANTERIOR CIRCULATION: --Intracranial internal carotid arteries: Atherosclerotic calcification with moderate narrowing bilaterally. --Anterior cerebral arteries (ACA): Normal. Both A1 segments are present. Patent anterior communicating artery (a-comm). --Middle cerebral arteries (MCA): Multifocal atherosclerotic irregularity without occlusion or high-grade stenosis. VENOUS SINUSES: As permitted by contrast timing, patent. ANATOMIC VARIANTS:  None Review of the MIP images confirms the above findings. IMPRESSION: 1. No acute intracranial hemorrhage or mass effect. 2. 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 3. No occlusion or hemodynamically significant stenosis in the neck. Electronically Signed   By: Ulyses Jarred M.D.   On: 07/30/2021 21:30   CT Lumbar Spine Wo Contrast  Result Date: 07/30/2021 CLINICAL DATA:  Low back pain EXAM: CT LUMBAR SPINE WITHOUT CONTRAST TECHNIQUE: Multidetector CT imaging of the lumbar spine was performed without intravenous contrast administration. Multiplanar CT image reconstructions were also generated. RADIATION DOSE REDUCTION: This exam was performed according to the departmental dose-optimization program which includes automated exposure control, adjustment of the mA and/or kV according to patient size and/or use of iterative reconstruction technique. COMPARISON:  CT 07/17/2021 FINDINGS: Segmentation: 5 lumbar type vertebrae. Alignment: Normal. Vertebrae: No acute fracture or focal pathologic process. Paraspinal and other soft tissues: Aortic atherosclerosis. No paravertebral or paraspinal soft tissue abnormality. Disc levels: At T12-L1, maintained disc space. No canal stenosis. The foramen are patent bilaterally. At L1-L2, maintained disc space. No canal stenosis. Mild bilateral foraminal narrowing. At L2-L3, disc space narrowing and below. Hypertrophic facet degenerative changes. Diffuse disc bulge with mild canal stenosis. Right greater than left foraminal narrowing. At L3-L4, disc space narrowing and vacuum disc. Diffuse disc bulge with moderate canal stenosis. Moderate facet degenerative changes bilaterally. Moderate bilateral foraminal narrowing. At L4-L5, disc space narrowing and vacuum disc. Moderate canal stenosis. Hypertrophic facet degenerative changes. Moderate bilateral foraminal stenosis. At L5-S1, disc space narrowing. Diffuse disc bulge. No canal stenosis. Advanced hypertrophic facet degenerative changes. Moderate bilateral foraminal narrowing. IMPRESSION: 1. No acute osseous abnormality. 2. Redemonstrated advanced lumbar degenerative changes with at  least moderate canal stenosis at L3-L4 and L4-L5. Multilevel foraminal stenosis L2-L3 through L5-S1. Electronically Signed   By: Donavan Foil M.D.   On: 07/30/2021 22:12    PHYSICAL EXAM  Temp:  [97.5 F (36.4 C)-98.2 F (36.8 C)] 98 F (36.7 C) (01/27 0424) Pulse Rate:  [73-86] 86 (01/27 0744) Resp:  [16-20] 16 (01/27 0744) BP: (100-159)/(65-90) 159/90 (01/27 0744) SpO2:  [96 %-100 %] 99 % (01/27 0744)  General - Well nourished, well developed, in no apparent distress.  Cardiovascular - Tachycardic (afib).  Mental Status -  Level of arousal and orientation to time, place, and person were intact. Language including expression, naming, repetition, comprehension was assessed and found intact. Attention span and concentration were normal. Recent and remote memory were intact. Fund of Knowledge was assessed and was intact.  Cranial Nerves II - XII - II - Visual field intact OU. III, IV, VI - Extraocular movements intact. V - Facial sensation intact bilaterally. VII - Facial movement intact bilaterally. VIII - Hearing & vestibular intact bilaterally. X - Palate elevates symmetrically. XI - Chin turning & shoulder shrug intact bilaterally. XII - Tongue protrusion intact.  Motor Strength - LLE significantly weaker than right, LUE/RLE/RUE 5/5  Motor Tone - Muscle tone was assessed at the neck and appendages and was normal.  Reflexes - The patients reflexes were symmetrical in all extremities and he had no pathological reflexes.  Sensory - Reports difference in sensation to light touch in LLE but unable to specify.    Coordination - The patient had normal movements in the hands and feet with no ataxia or dysmetria.  Tremor was absent.  Gait and Station - deferred.  ASSESSMENT/PLAN Mr. Servando Kyllonen is a 81 y.o. male with history of  MI w/ ICD, CAD s/p stent, cerebellar stroke, HTN, HLD, PAF noncompliant with eliquis, BPH presenting with several days of left sided weakness.    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 No occlusion or hemodynamically significant stenosis in the neck. Head MRI  pending Thoracic/Lumbar MRI pending 2D Echo EF 45-50% LDL 49 HgbA1c 6.2 VTE prophylaxis - Eliquis No antithrombotic prior to admission, now on Eliquis 5mg  bid. Therapy recommendations: SNF Disposition:  pending  Hypertension Home meds:  diltiazem Avoid low BP given severe posterior circulation stenosis Long-term BP goal 1 30-1 50 given severe posterior circulation stenosis  Hyperlipidemia Home meds:  Lipitor 40 mg, resumed in hospital LDL 49, goal < 70 Continue statin at discharge  Other Stroke Risk Factors Advanced Age >/= 46  Coronary artery disease Congestive heart failure  Other Active Problems MI w/ stents AICD conditional with MRI   Hospital day # 0  France Ravens, MD PGY1 Resident   To contact Stroke Continuity provider, please refer to [email or web address] After hours, contact General Neurology

## 2021-08-01 DIAGNOSIS — C7932 Secondary malignant neoplasm of cerebral meninges: Secondary | ICD-10-CM | POA: Diagnosis present

## 2021-08-01 DIAGNOSIS — R531 Weakness: Secondary | ICD-10-CM

## 2021-08-01 DIAGNOSIS — I480 Paroxysmal atrial fibrillation: Secondary | ICD-10-CM | POA: Diagnosis present

## 2021-08-01 DIAGNOSIS — R339 Retention of urine, unspecified: Secondary | ICD-10-CM | POA: Diagnosis not present

## 2021-08-01 DIAGNOSIS — Z7982 Long term (current) use of aspirin: Secondary | ICD-10-CM | POA: Diagnosis not present

## 2021-08-01 DIAGNOSIS — R52 Pain, unspecified: Secondary | ICD-10-CM | POA: Diagnosis not present

## 2021-08-01 DIAGNOSIS — M5417 Radiculopathy, lumbosacral region: Secondary | ICD-10-CM | POA: Diagnosis present

## 2021-08-01 DIAGNOSIS — Z880 Allergy status to penicillin: Secondary | ICD-10-CM | POA: Diagnosis not present

## 2021-08-01 DIAGNOSIS — Z950 Presence of cardiac pacemaker: Secondary | ICD-10-CM | POA: Diagnosis not present

## 2021-08-01 DIAGNOSIS — R270 Ataxia, unspecified: Secondary | ICD-10-CM | POA: Diagnosis not present

## 2021-08-01 DIAGNOSIS — Z888 Allergy status to other drugs, medicaments and biological substances status: Secondary | ICD-10-CM | POA: Diagnosis not present

## 2021-08-01 DIAGNOSIS — Z7189 Other specified counseling: Secondary | ICD-10-CM | POA: Diagnosis not present

## 2021-08-01 DIAGNOSIS — Z515 Encounter for palliative care: Secondary | ICD-10-CM | POA: Diagnosis not present

## 2021-08-01 DIAGNOSIS — L89300 Pressure ulcer of unspecified buttock, unstageable: Secondary | ICD-10-CM | POA: Diagnosis not present

## 2021-08-01 DIAGNOSIS — Z20822 Contact with and (suspected) exposure to covid-19: Secondary | ICD-10-CM | POA: Diagnosis present

## 2021-08-01 DIAGNOSIS — C801 Malignant (primary) neoplasm, unspecified: Secondary | ICD-10-CM | POA: Diagnosis present

## 2021-08-01 DIAGNOSIS — Z8673 Personal history of transient ischemic attack (TIA), and cerebral infarction without residual deficits: Secondary | ICD-10-CM | POA: Diagnosis not present

## 2021-08-01 DIAGNOSIS — I10 Essential (primary) hypertension: Secondary | ICD-10-CM | POA: Diagnosis present

## 2021-08-01 DIAGNOSIS — N138 Other obstructive and reflux uropathy: Secondary | ICD-10-CM | POA: Diagnosis present

## 2021-08-01 DIAGNOSIS — Z79899 Other long term (current) drug therapy: Secondary | ICD-10-CM | POA: Diagnosis not present

## 2021-08-01 DIAGNOSIS — G609 Hereditary and idiopathic neuropathy, unspecified: Secondary | ICD-10-CM | POA: Diagnosis present

## 2021-08-01 DIAGNOSIS — C7949 Secondary malignant neoplasm of other parts of nervous system: Secondary | ICD-10-CM | POA: Diagnosis not present

## 2021-08-01 DIAGNOSIS — R296 Repeated falls: Secondary | ICD-10-CM | POA: Diagnosis present

## 2021-08-01 DIAGNOSIS — Z7901 Long term (current) use of anticoagulants: Secondary | ICD-10-CM | POA: Diagnosis not present

## 2021-08-01 DIAGNOSIS — L89152 Pressure ulcer of sacral region, stage 2: Secondary | ICD-10-CM | POA: Diagnosis not present

## 2021-08-01 DIAGNOSIS — E222 Syndrome of inappropriate secretion of antidiuretic hormone: Secondary | ICD-10-CM | POA: Diagnosis present

## 2021-08-01 DIAGNOSIS — M25559 Pain in unspecified hip: Secondary | ICD-10-CM | POA: Diagnosis not present

## 2021-08-01 DIAGNOSIS — Z91199 Patient's noncompliance with other medical treatment and regimen due to unspecified reason: Secondary | ICD-10-CM | POA: Diagnosis not present

## 2021-08-01 DIAGNOSIS — G2581 Restless legs syndrome: Secondary | ICD-10-CM | POA: Diagnosis not present

## 2021-08-01 DIAGNOSIS — Z66 Do not resuscitate: Secondary | ICD-10-CM | POA: Diagnosis not present

## 2021-08-01 DIAGNOSIS — Z8674 Personal history of sudden cardiac arrest: Secondary | ICD-10-CM | POA: Diagnosis not present

## 2021-08-01 DIAGNOSIS — G834 Cauda equina syndrome: Secondary | ICD-10-CM | POA: Diagnosis present

## 2021-08-01 DIAGNOSIS — Z95810 Presence of automatic (implantable) cardiac defibrillator: Secondary | ICD-10-CM | POA: Diagnosis not present

## 2021-08-01 LAB — CBC
HCT: 44.1 % (ref 39.0–52.0)
Hemoglobin: 14.9 g/dL (ref 13.0–17.0)
MCH: 28.5 pg (ref 26.0–34.0)
MCHC: 33.8 g/dL (ref 30.0–36.0)
MCV: 84.5 fL (ref 80.0–100.0)
Platelets: 283 10*3/uL (ref 150–400)
RBC: 5.22 MIL/uL (ref 4.22–5.81)
RDW: 13.2 % (ref 11.5–15.5)
WBC: 12.5 10*3/uL — ABNORMAL HIGH (ref 4.0–10.5)
nRBC: 0 % (ref 0.0–0.2)

## 2021-08-01 LAB — BASIC METABOLIC PANEL
Anion gap: 13 (ref 5–15)
BUN: 12 mg/dL (ref 8–23)
CO2: 21 mmol/L — ABNORMAL LOW (ref 22–32)
Calcium: 8.6 mg/dL — ABNORMAL LOW (ref 8.9–10.3)
Chloride: 94 mmol/L — ABNORMAL LOW (ref 98–111)
Creatinine, Ser: 0.67 mg/dL (ref 0.61–1.24)
GFR, Estimated: >60 mL/min (ref >60)
Glucose, Bld: 121 mg/dL — ABNORMAL HIGH (ref 70–99)
Potassium: 4.5 mmol/L (ref 3.5–5.1)
Sodium: 128 mmol/L — ABNORMAL LOW (ref 135–145)

## 2021-08-01 MED ORDER — DOXYCYCLINE HYCLATE 100 MG PO TABS
100.0000 mg | ORAL_TABLET | Freq: Two times a day (BID) | ORAL | Status: DC
Start: 2021-08-01 — End: 2021-08-01

## 2021-08-01 MED ORDER — DOXYCYCLINE HYCLATE 100 MG PO TABS
100.0000 mg | ORAL_TABLET | Freq: Two times a day (BID) | ORAL | Status: DC
  Administered 2021-08-01 – 2021-08-02 (×2): 100 mg via ORAL
  Filled 2021-08-01 (×2): qty 1

## 2021-08-01 MED ORDER — CHLORHEXIDINE GLUCONATE CLOTH 2 % EX PADS
6.0000 | MEDICATED_PAD | Freq: Every day | CUTANEOUS | Status: DC
  Administered 2021-08-02 – 2021-08-11 (×10): 6 via TOPICAL

## 2021-08-01 NOTE — Care Management Obs Status (Signed)
[place] NOTIFICATION   Patient Details  Name: Ronald Sheppard MRN: [id number] Date of Birth: 1940-07-19   Medicare Observation Status Notification Given:  Yes    Tom-Johnson, Renea Ee, RN 08/01/2021, 9:30 AM

## 2021-08-01 NOTE — NC FL2 (Signed)
[place] LEVEL OF CARE SCREENING TOOL     IDENTIFICATION  Patient Name: Ronald Sheppard Birthdate: 1941-02-09 Sex: male Admission Date (Current Location): 07/30/2021  [facility] and [place] Number:  [employer] and Address:  [facility] [place]. [facility] [place] [address] [place], [place] [address]      Provider Number:    Attending Physician Name and Address:  Lenoria Chime, MD  Relative Name and Phone Number:  Germaine Pomfret [phone]    Current Level of Care: Hospital Recommended Level of Care: [place] Prior Approval Number:    Date Approved/Denied:   PASRR Number: [id number]  Discharge Plan: SNF    Current Diagnoses: Patient Active Problem List   Diagnosis Date Noted   Generalized weakness 08/01/2021   Weakness 07/31/2021   Pressure injury of skin 07/31/2021   Pacemaker    Frequent falls    Lumbar radiculopathy    Cerebrovascular disease    Late effects of CVA (cerebrovascular accident)    Coronary artery disease involving native heart without angina pectoris    Cerebellar infarct ([place]) 07/28/2015   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 ([place]) 07/28/2015   Idiopathic peripheral neuropathy 07/28/2015   Risk for falls 07/28/2015    Orientation RESPIRATION BLADDER Height & Weight     Self, Time, Situation  Normal Incontinent, External catheter Weight:   Height:     BEHAVIORAL SYMPTOMS/MOOD NEUROLOGICAL BOWEL NUTRITION STATUS      Continent Diet (See dc summary)  AMBULATORY STATUS COMMUNICATION OF NEEDS Skin   Total Care Verbally PU Stage and Appropriate Care PU Stage 1 Dressing: Daily PU Stage 2 Dressing: Daily PU Stage 3 Dressing: Daily                 Personal Care Assistance Level of Assistance  Bathing, Feeding, Dressing, Total care Bathing Assistance: Maximum assistance Feeding assistance: Maximum assistance Dressing Assistance: Maximum assistance Total Care  Assistance: Maximum assistance   Functional Limitations Info  Sight, Hearing, Speech Sight Info: Adequate Hearing Info: Adequate Speech Info: Adequate    SPECIAL CARE FACTORS FREQUENCY  PT (By licensed PT), OT (By licensed OT)     PT Frequency: 5x week OT Frequency: 5x week            Contractures Contractures Info: Not present    Additional Factors Info  Code Status, Allergies, Psychotropic, Insulin Sliding Scale Code Status Info: Full Allergies Info: Amlodipine   Other   Penicillins           Current Medications (08/01/2021):  This is the current hospital active medication list Current Facility-Administered Medications  Medication Dose Route Frequency Provider Last Rate Last Admin   apixaban (ELIQUIS) tablet 5 mg  5 mg Oral BID Gifford Shave, MD   5 mg at 08/01/21 9244   aspirin EC tablet 81 mg  81 mg Oral Daily Gifford Shave, MD   81 mg at 08/01/21 0912   atorvastatin (LIPITOR) tablet 40 mg  40 mg Oral Daily Gifford Shave, MD   40 mg at 08/01/21 6286   diltiazem (CARDIZEM CD) 24 hr capsule 120 mg  120 mg Oral Daily Gifford Shave, MD   120 mg at 08/01/21 0911   tamsulosin (FLOMAX) capsule 0.4 mg  0.4 mg Oral QHS Gifford Shave, MD   0.4 mg at 07/31/21 2340     Discharge Medications: Please see discharge summary for a list of discharge medications.  Relevant Imaging Results:  Relevant Lab  Results:   Additional Information SSN [id number]  Loreta Ave, LCSWA

## 2021-08-01 NOTE — Progress Notes (Addendum)
STROKE TEAM PROGRESS NOTE   INTERVAL HISTORY Patient seen in room, CNAs at the bedside. Awaiting MRI of brain c/t spine. Patient is still unable to lift left leg or wiggle left toes. He states that his leg "just doesn't work"  Vitals:   07/31/21 2350 08/01/21 0305 08/01/21 0307 08/01/21 0310  BP: (!) 130/95   (!) 135/91  Pulse: 93   81  Resp: 19   17  Temp: 98.5 F (36.9 C)   98.1 F (36.7 C)  TempSrc: Oral   Oral  SpO2: 97% (!) 82% (!) 89% 99%   CBC:  Recent Labs  Lab 07/31/21 0056 08/01/21 0616  WBC 15.7* 12.5*  HGB 13.8 14.9  HCT 40.7 44.1  MCV 85.7 84.5  PLT 338 625    Basic Metabolic Panel:  Recent Labs  Lab 07/31/21 1247 08/01/21 0616  NA 129* 128*  K 4.0 4.5  CL 93* 94*  CO2 27 21*  GLUCOSE 137* 121*  BUN 12 12  CREATININE 0.73 0.67  CALCIUM 9.0 8.6*    Lipid Panel:  Recent Labs  Lab 07/31/21 1247  CHOL 118  TRIG 45  HDL 60  CHOLHDL 2.0  VLDL 9  LDLCALC 49   HgbA1c:  Recent Labs  Lab 07/31/21 0056  HGBA1C 6.2*    Urine Drug Screen:  Recent Labs  Lab 07/31/21 1020  LABOPIA POSITIVE*  COCAINSCRNUR NONE DETECTED  LABBENZ NONE DETECTED  AMPHETMU NONE DETECTED  THCU NONE DETECTED  LABBARB NONE DETECTED     Alcohol Level No results for input(s): ETH in the last 168 hours.  IMAGING past 24 hours DG Chest 1 View  Result Date: 07/31/2021 CLINICAL DATA:  Left leg weakness since 1430 hours yesterday EXAM: CHEST  1 VIEW COMPARISON:  02/10/2021 FINDINGS: Right basilar atelectasis. No focal consolidation. No pleural effusion or pneumothorax. Heart and mediastinal contours are unremarkable. Dual lead cardiac pacemaker. No acute osseous abnormality. IMPRESSION: No acute cardiopulmonary disease. Electronically Signed   By: Kathreen Devoid M.D.   On: 07/31/2021 09:06    PHYSICAL EXAM  Temp:  [98 F (36.7 C)-98.5 F (36.9 C)] 98.1 F (36.7 C) (01/28 0310) Pulse Rate:  [43-93] 81 (01/28 0310) Resp:  [14-19] 17 (01/28 0310) BP: (130-192)/(79-152)  135/91 (01/28 0310) SpO2:  [82 %-100 %] 99 % (01/28 0310)  General - Well nourished, well developed, in no apparent distress.  Cardiovascular - Tachycardic (afib).  Mental Status -  Level of arousal and orientation to time, place, and person were intact. Language including expression, naming, repetition, comprehension was assessed and found intact.  Speech is dysarthric.    Cranial Nerves II - XII - II - Visual field intact OU. III, IV, VI - Extraocular movements intact. V - Facial sensation intact bilaterally. VII - Facial movement intact bilaterally. VIII - Hearing & vestibular intact bilaterally. X - Palate elevates symmetrically. XI - Chin turning & shoulder shrug intact bilaterally. XII - Tongue protrusion intact.  Motor Strength - LLE significantly weaker than right 4-/5; LUE/RLE/RUE 5/5  Motor Tone - Muscle tone was assessed at the neck and appendages and was normal. Reflexes - The patients reflexes were symmetrical in all extremities and he had no pathological reflexes. Sensory - Reports difference in sensation to light touch in LLE but unable to specify.   Coordination - The patient had normal movements in the hands and feet with no ataxia or dysmetria.  Tremor was absent. Gait and Station - deferred.  ASSESSMENT/PLAN Mr. Rohith Fauth is  a 81 y.o. male with history of MI w/ ICD, CAD s/p stent, cerebellar stroke, HTN, HLD, PAF noncompliant with eliquis due to cost, BPH presenting with several days of left sided weakness. MRI pending due to ICD.   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 No occlusion or hemodynamically significant stenosis in the neck. Head MRI  pending Thoracic/Lumbar MRI pending 2D Echo EF 45-50% LDL 49 HgbA1c  6.2 VTE prophylaxis - Eliquis No antithrombotic prior to admission, now on Eliquis 5mg  bid. Therapy recommendations: SNF Disposition:  pending  Hypertension Home meds:  diltiazem Avoid low BP given severe posterior circulation stenosis Long-term BP goal 1 30-1 50 given severe posterior circulation stenosis  Hyperlipidemia Home meds:  Lipitor 40 mg, resumed in hospital LDL 49, goal < 70 Continue statin at discharge  Other Stroke Risk Factors Advanced Age >/= 73  Coronary artery disease Congestive heart failure  Other Active Problems MI w/ stents AICD conditional with MRI   Hospital day # 0  Patient seen and examined by NP/APP with MD. MD to update note as needed.   Janine Ores, DNP, FNP-BC [facility] Pager: [phone]  ATTENDING ATTESTATION:  history of cardiac arrest w/ AICD, MI/CAD s/p stent, HTN, HLD, PAF noncompliant with eliquis admitted for left leg weakness for 10 days at [facility] then transferred to [facility]. Due to left leg weakness MRI brain/ c/t spine ordered but delayed due to ICD-likely will happen on Monday. On eliquis for stroke prevention.  Dr. Reeves Forth evaluated pt independently, reviewed imaging, chart, labs. Discussed and formulated plan with the APP. Please see APP note above for details.   Total 36 minutes spent on counseling patient and coordinating care, writing notes and reviewing chart.     Alston Berrie,MD    To contact Stroke Continuity provider, please refer to [email or web address] After hours, contact General Neurology

## 2021-08-01 NOTE — TOC Initial Note (Signed)
Transition of Care [facility]) - Initial/Assessment Note    Patient Details  Name: Ronald Sheppard MRN: [id number] Date of Birth: 09-17-1940  Transition of Care [facility]) CM/SW Contact:    [address], [place] Phone Number: 08/01/2021, 12:19 PM  Clinical Narrative:                 CSW received consult for possible SNF placement at time of discharge. CSW spoke with patient's daughter Erline Levine who says pt was at [facility] [place] until this admission for short term rehab. Stacy expressed understanding of PT recommendation and is agreeable to SNF placement at time of discharge and reports preference for [facility]. CSW discussed insurance authorization process and will provide Medicare SNF ratings list. Patient has received  COVID vaccines. CSW will send out referrals for review. Stacy expressed being hopeful for rehab and for her father to feel better soon. No further questions reported at this time.   Skilled Nursing Rehab Facilities-   [email or web address]   Ratings out of 5 possible   Name Address  Phone # [place] Inspection Overall  [facility] [address] [place] 5 5 2 4   [facility]  [address], [place] [phone] 4 2 5 5   [facility] [place], [place] 4 1 1 1   [place], [place] 2 2 4 4   [facility] [place] [address], [place] 2 1 2 1   [place] [address] [place] 3 1 4 3   [facility] [address], [place] 5 2 2 3   [facility] [address], [place] 4 1 2 1   [address] ([place]) [place], [place] [phone] 5 1 2 2   [facility] [address], [place] [phone] 4 1 1 1   [facility] [address], [facility] [phone] 4 1 2 1   [facility] ([place]) [place]  [place], [place] [phone] 4 1 1 1           [place] [address], [place] 4 2 3 3   [facility] [address] [place] 3 1 5 4   [place], [place] [phone] 2 1 1 1   [facility] [address], [employer] [phone] 2 2 3 3           [address] (no [facility]) [place] [address], [place] [phone] 4 5 5 5   [facility] (No Hum[employer]) [address], [place] 4 1 4 3   [facility] (No UHC) [place], [place] 5 5 5 5   [facility] [address], [place] [phone] 3 3 4 4   [place] [address] [place]  [phone] 2 2 2 2   [facility] [address] [place] [phone] 3 1 3 2   [facility] [address] [place] 1 1 2 1   [facility] [address] [place] [phone] 2 1 1 1   [place], [place] 5 2 4 5   [facility] [address] [place] 2 1 1 1   [facility] [address], [place] 3 1 1 1   [facility] [place], [place] 2 1 2 1           [facility]  [place] [address], [place] [phone] 5 3 3 4   [place] [address], [place] 2 1 1 1   [place] (No Humana) [address] [place] [phone] 2 1 2 1   [facility] [address] [place] [phone] 3 1 1 1           [facility] [place], [place] 4 1 5 4   [facility]) [place], [place] 2 1 3 2   [facility]) [place] [address] [place] 3 1 4 3   [facility] [address] [place] 4 1 4 3   [address] [place], [place] 3 3 1 1    [facility]) [address] [place] [phone] 3 2 3 3            Patient Goals and CMS Choice        Expected Discharge Plan and Services                                                Prior Living Arrangements/Services                       Activities of Daily Living      Permission Sought/Granted                  Emotional Assessment              Admission diagnosis:  Weakness [R53.1] Pacemaker [Z95.0] Generalized weakness [R53.1] Patient Active Problem List   Diagnosis Date Noted   Generalized weakness 08/01/2021   Weakness 07/31/2021   Pressure injury of skin 07/31/2021   Pacemaker    Frequent falls    Lumbar radiculopathy    Cerebrovascular disease    Late effects of CVA (cerebrovascular accident)    Coronary artery disease involving native heart without angina pectoris    Cerebellar infarct ([place]) 07/28/2015   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

## 2021-08-01 NOTE — Progress Notes (Signed)
FPTS Brief Progress Note  S: Patient is resting comfortably when we entered the room.  Nurse reports that he has sleep apnea and will intermittently desaturate.  She is going to have nasal cannula O2 at bedside but not initiate less needed.  Patient reports that he is doing well and is oriented to person place and time.   O: BP (!) 130/95 (BP Location: Right Arm)    Pulse 93    Temp 98.5 F (36.9 C) (Oral)    Resp 19    SpO2 97%     A/P: Subacute stroke Stable from this morning.  Awaiting MRI. - Follow-up MRI - Continue Eliquis 5 mg twice daily - Continue to monitor for worsening signs or symptoms  Hyponatremia Morning BMP ordered  Urinary retention Foley catheter in place, plan for voiding trial tomorrow  - Orders reviewed. Labs for AM ordered, which was adjusted as needed.    Gifford Shave, MD 08/01/2021, 12:05 AM PGY-3, [facility] Night Resident  Please page [phone] with questions.

## 2021-08-02 ENCOUNTER — Inpatient Hospital Stay (HOSPITAL_COMMUNITY): Payer: Medicare Other

## 2021-08-02 LAB — BASIC METABOLIC PANEL
Anion gap: 10 (ref 5–15)
BUN: 14 mg/dL (ref 8–23)
CO2: 25 mmol/L (ref 22–32)
Calcium: 8.6 mg/dL — ABNORMAL LOW (ref 8.9–10.3)
Chloride: 91 mmol/L — ABNORMAL LOW (ref 98–111)
Creatinine, Ser: 0.72 mg/dL (ref 0.61–1.24)
GFR, Estimated: >60 mL/min (ref >60)
Glucose, Bld: 130 mg/dL — ABNORMAL HIGH (ref 70–99)
Potassium: 3.8 mmol/L (ref 3.5–5.1)
Sodium: 126 mmol/L — ABNORMAL LOW (ref 135–145)

## 2021-08-02 LAB — OSMOLALITY: Osmolality: 274 mOsm/kg — ABNORMAL LOW (ref 275–295)

## 2021-08-02 IMAGING — DX DG PORTABLE PELVIS
1 series · 1 of 1 positions shown · non-contrast
Comparison: None.

CLINICAL DATA: Weakness. Pelvis and hip pain.

EXAM:
PORTABLE PELVIS 1-2 VIEWS

[pelvis]
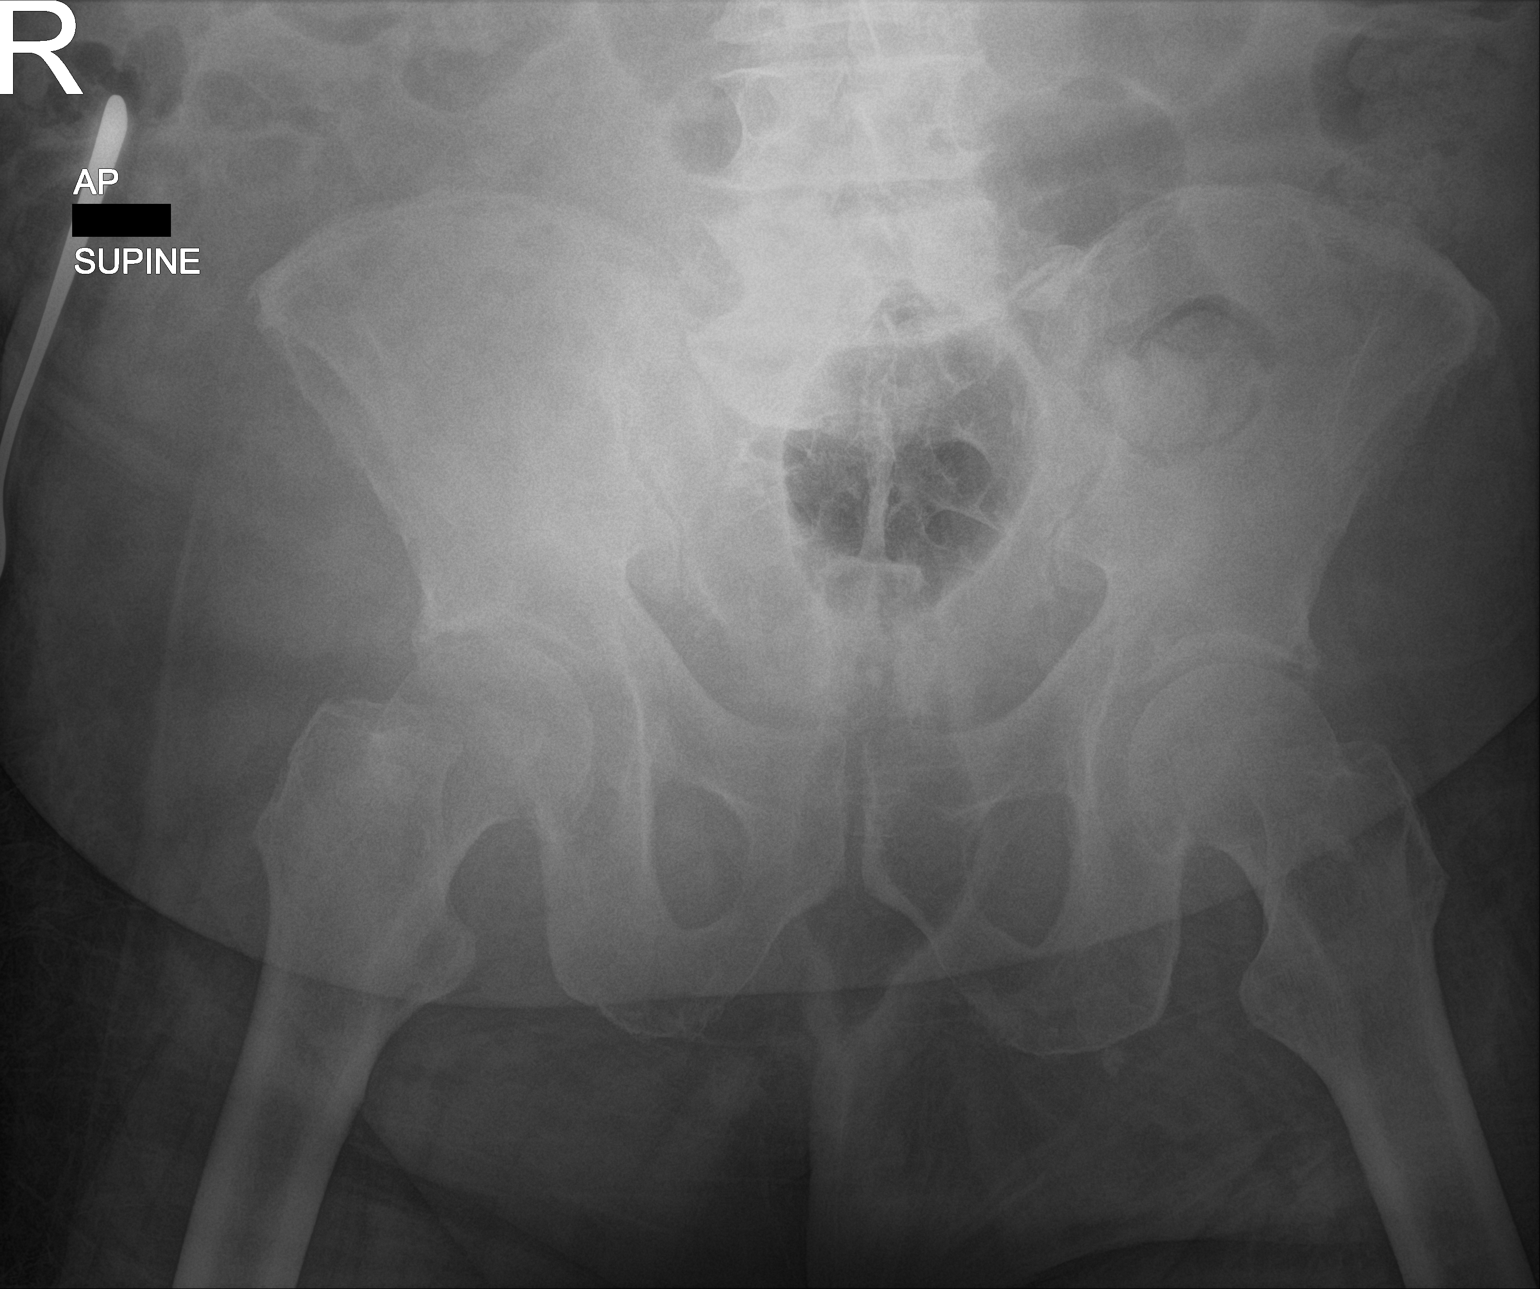

[1 of 1 positions shown; findings below may reference images not displayed]

FINDINGS: There is no evidence of pelvic fracture or diastasis. No pelvic bone
lesions are seen. Hips are unremarkable in appearance on this one
view pelvis radiograph. Lower lumbar spine degenerative changes are
noted.
IMPRESSION: No acute findings.

## 2021-08-02 NOTE — Progress Notes (Signed)
FPTS Brief Progress Note  S: Patient is sleeping comfortably on evaluation.  Did not wake up.   O: BP (!) 135/104 (BP Location: Right Arm)    Pulse 83    Temp 98.5 F (36.9 C) (Axillary)    Resp (!) 23    SpO2 99%     A/P: Continuing current plan from progress note today.  Awaiting MRI hopefully tomorrow. - Orders reviewed. Labs for AM ordered, which was adjusted as needed.   Gifford Shave, MD 08/02/2021, 8:36 PM PGY-3, [place] [facility] Night Resident  Please page [phone] with questions.

## 2021-08-02 NOTE — Consult Note (Signed)
Urology Consult Note   Requesting Attending Physician:  Lenoria Chime, MD Service Providing Consult: Urology  Consulting Attending: Raynelle Bring MD  Reason for Consult:  urinary retention/ penile purulence  HPI: Ronald Sheppard is seen in consultation for reasons noted above at the request of Lenoria Chime, MD for evaluation of urinary retention and purulence near catheter insertion.  This is a 81 y.o. male with PMH is significant for MI w/ pacemake and defibrillator CAD s/p stent, cerebellar stroke, HTN, HLD, PAF, BPH. Much of history was obtained form his daughter. Patient presented to ER after suffering fall at home. He had a catheter in place that was placed by EMS about two weeks prior for urinary retention. Per patient's daughter, he had followed with a urologist in the remote past for elevated PSA and BPH but has not been following up with medical providers recently. He lives in [place]. Patient presented after several falls with lower extremity weakness and new bowel incontinence. He is being worked up for possible radiculopathy versus stroke. He is to undergo an MRI on Monday. He is on elliquis chronically.   Catheter was exchanged by hospital staff yesterday. He had not underwent a TOV this admission. Upon exchange, purulence was noted in the glans/around it for which we were consulted. A culture was obtained and patient was started on doxycycline. Additionally, a urine culture was sent.   Past Medical History: No past medical history on file.  Past Surgical History:    Medication: Current Facility-Administered Medications  Medication Dose Route Frequency Provider Last Rate Last Admin   apixaban (ELIQUIS) tablet 5 mg  5 mg Oral BID Gifford Shave, MD   5 mg at 08/02/21 0809   aspirin EC tablet 81 mg  81 mg Oral Daily Gifford Shave, MD   81 mg at 08/02/21 0810   atorvastatin (LIPITOR) tablet 40 mg  40 mg Oral Daily Gifford Shave, MD   40 mg at 08/02/21 0810    Chlorhexidine Gluconate Cloth 2 % PADS 6 each  6 each Topical Daily Lenoria Chime, MD   6 each at 08/02/21 0617   diltiazem (CARDIZEM CD) 24 hr capsule 120 mg  120 mg Oral Daily Gifford Shave, MD   120 mg at 08/02/21 0810   doxycycline (VIBRA-TABS) tablet 100 mg  100 mg Oral Q12H Autry-Lott, Simone, DO   100 mg at 08/02/21 0810   tamsulosin (FLOMAX) capsule 0.4 mg  0.4 mg Oral QHS Gifford Shave, MD   0.4 mg at 08/01/21 2058    Allergies: Allergies  Allergen Reactions   Amlodipine    Other     MSG   Penicillins     Social History: Social History   Tobacco Use   Smoking status: Unknown   Smokeless tobacco: Never    Family History No family history on file.  Review of Systems 10 systems were reviewed and are negative except as noted specifically in the HPI.  Objective   Vital signs in last 24 hours: BP (!) 135/104 (BP Location: Right Arm)    Pulse 83    Temp 98.5 F (36.9 C) (Axillary)    Resp (!) 23    SpO2 99%   Physical Exam General: NAD, A&O, resting, appropriate HEENT: [place]/AT, EOMI, MMM Pulmonary: Normal work of breathing Cardiovascular: HDS, adequate peripheral perfusion Abdomen: Soft, NTTP, nondistended. 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 Unclear if chronic or due to catheter  associated erosion. Some granulation tissue around the catheter at the meatus.  Extremities: warm and well perfused Neuro: Appropriate, . Moves all extremities.   Most Recent Labs: Lab Results  Component Value Date   WBC 12.5 (H) 08/01/2021   HGB 14.9 08/01/2021   HCT 44.1 08/01/2021   PLT 283 08/01/2021    Lab Results  Component Value Date   NA 126 (L) 08/02/2021   K 3.8 08/02/2021   CL 91 (L) 08/02/2021   CO2 25 08/02/2021   BUN 14 08/02/2021   CREATININE 0.72 08/02/2021   CALCIUM 8.6 (L) 08/02/2021    Lab Results  Component Value Date   INR 1.2 07/30/2021   APTT 32 07/30/2021      Urine Culture: @[id number] (laburin,org,[id number],[id number])@   IMAGING: No results found.  ------  Assessment:  81 y.o. male with PMH CAD, PAF on elliquis with pacemaker/defibrillator in place, cerebellat stroke, HTN, HLD< BPH presenting with falls, lower extremity weakness, bowel incontinence.   We are consulted for urinary retention. Catheter placed by EMS about 2 weeks prior. Exchanged in house yesterda and purulence noted at glans.   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 Unclear if congenital or due to catheter associated erosion. Some granulation vs purulence around the catheter. This has been cultured and patient is on doxycycline in the interim. Urine culture also pending.   Discussed with daughter and with primary team retention work-up. Multifactorial likely with both Bladder outlet obstruction due to BPH, and could be neurogenic component as well given constellation of neurogenic symptoms. Decided to defer tov for now until after MRI obtained and after patient has improved with mobility and strength.   If at that point patient fails TOV, we discussed methods of bladder decompression chronically including chronic indwelling foley catheter, Suprapubic catheter, or clean intermittent catheterization (CIC). CIC may be difficult in him due to neuro deficit, anticoagulation, and BPH, putting him at risk for prostatic bleeding/trauma. Suprapubic catheter may avoid the risk of further urethral erosion and might be best for him. We will discuss again further when time approaches.   If he recovers enough as an outpatient to want to consider a blader outlet procedure, he would need further work-up as an outpatientincluding cystoscopy, transrectal ultrasound (to size prostate) and likely urodynamics to assess bladder function. At this time, he is not a great surgical candidate and needs to focus on neurologic comorbidity.   Recommendations: - Continue foley to drainage for now. TOV once  patient recovering in strength and mobility.  - If fails TOV, catheter should be reinserted and we will discuss bladder decompression options above again with daughter and patient.    Thank you for this consult. Please contact the urology consult pager with any further questions/concerns.

## 2021-08-02 NOTE — Progress Notes (Signed)
FPTS Brief Progress Note  S: patient asleep in bed   O: BP 108/86 (BP Location: Right Arm)    Pulse 78    Temp 98.2 F (36.8 C) (Axillary)    Resp 20    SpO2 100%     A/P: Continue current plan, see progress note from today. - Orders reviewed. Labs for AM ordered, which was adjusted as needed.   Gladys Damme, MD 08/02/2021, 12:37 AM PGY-3, [facility] Night Resident  Please page [phone] with questions.

## 2021-08-02 NOTE — Progress Notes (Signed)
Family Medicine Teaching Service Daily Progress Note Intern Pager: [phone]  Patient name: Ronald Sheppard Medical record number: [id number] Date of birth: 27-Mar-1941 Age: 81 y.o. Gender: male  Primary Care Provider: Raina Mina., MD Consultants: Neurology Code Status: Full  Pt Overview and Major Events to Date:  07/30/21 - Patient admitted  Assessment and Plan:  Ronald Sheppard is a 81 y.o. male presenting with fall. PMH is significant for MI w/ pacemake and defibrillator CAD s/p stent, cerebellar stroke, HTN, HLD, PAF, BPH   Fall   Subacute Stroke Patient neuro exam unchanged, he will continue to work with PT/OT. MRI to happen Monday, held because patient has defibrillator. -Stroke team following, appreciate recs -MRI brain & lumbar spine ordered, f/u imaging once complete -Continue Eliquis 5 mg -PT/OT eval and treat  Urinary retention   lesion on glans Foley catheter in prior to admission, this was discovered yesterday and exchanged for a new one. Patient has small lesion on glans of penis and was started on Doxycycline yesterday. Urology saw patient and recommended foley remain, unsure etiology of lesion. Will continue foley and re-assess once imaging and cultures result. Patient UOP was 250 cc yesterday.  -continue doxycycline 100 mg BID -continue flomax 0.4 mg daily -continue foley  Hip pain Patient complaining of hip pain and history of falls. Will obtain pelvic x-ray to evaluate for fracture or malalignment. -pelvic X-ray  Hyponatremia Stable from  yesterday at 126 (128). Will work up.  -continue to monitor -Urine osm, Serum Osm  Hx of Cerebellar stroke -Continue fall precautions  Hx of MI   CAD with stents MI in 2020 w/ stent in LAD an CX and defibrillator placed.  -Continue cardiac monitoring -Continue ASA 81 mg daily  Hx of PAF on Eliquis Patient rate controlled in with telemetry in A. Fib??? Patient had not been taking Eliquis for 6wk prior to admission 2/2  cost.  -Continue cardiac monitoring -Continue diltiazem 120 mg daily -Continue eliquis 5 mg BID  HLD Continue atorvastin 40 mg daily  HTN BP range 100-130's/70-100's, avoiding HTN as stroke is subacute, and avoiding hypotension for posterior circulation senosis.  -Continue Cardizem 120 mg dialy -Continue to monitor  FEN/GI: Carb modified PPx: Eliquis Dispo:SNF in 2-3 days.    Subjective:  Patient feeling well today, no complaints of pain, concerned that his leg will not improve.   Objective: Temp:  [97.6 F (36.4 C)-98.5 F (36.9 C)] 98.5 F (36.9 C) (01/29 0328) Pulse Rate:  [73-84] 83 (01/29 0800) Resp:  [19-28] 23 (01/29 0800) BP: (108-138)/(73-104) 135/104 (01/29 0800) SpO2:  [92 %-100 %] 99 % (01/29 0800) Physical Exam: General: Well appearing, polite, NAD, white male Cardiovascular: Distant heart sounds, NR[facility] Respiratory: CTABL Abdomen: Soft, NTTP, non-distended Extremities: Moving all extremities independently, Right leg strength 2/5, all other extremities 5/5  Laboratory: Recent Labs  Lab 07/30/21 1236 07/31/21 0056 08/01/21 0616  WBC 14.2* 15.7* 12.5*  HGB 13.6 13.8 14.9  HCT 41.4 40.7 44.1  PLT 346 338 283   Recent Labs  Lab 07/31/21 1247 08/01/21 0616 08/02/21 0230  NA 129* 128* 126*  K 4.0 4.5 3.8  CL 93* 94* 91*  CO2 27 21* 25  BUN 12 12 14   CREATININE 0.73 0.67 0.72  CALCIUM 9.0 8.6* 8.6*  GLUCOSE 137* 121* 130*      Imaging/Diagnostic Tests:   [name], MD 08/02/2021, 8:56 AM PGY-1, [place] Intern pager: [phone], text pages welcome

## 2021-08-02 NOTE — Progress Notes (Addendum)
STROKE TEAM PROGRESS NOTE   INTERVAL HISTORY Patient seen in room. Awaiting MRI of brain c/t spine tomorrow hopefully. Overall, patient is more alert and speaking clearer today than he did yesterday.   Vitals:   08/02/21 0500 08/02/21 0600 08/02/21 0700 08/02/21 0800  BP:    (!) 135/104  Pulse: 83 74 75 83  Resp: (!) 23 (!) 24 (!) 24 (!) 23  Temp:      TempSrc:      SpO2: 100% 100% 100% 99%   CBC:  Recent Labs  Lab 07/31/21 0056 08/01/21 0616  WBC 15.7* 12.5*  HGB 13.8 14.9  HCT 40.7 44.1  MCV 85.7 84.5  PLT 338 007    Basic Metabolic Panel:  Recent Labs  Lab 08/01/21 0616 08/02/21 0230  NA 128* 126*  K 4.5 3.8  CL 94* 91*  CO2 21* 25  GLUCOSE 121* 130*  BUN 12 14  CREATININE 0.67 0.72  CALCIUM 8.6* 8.6*    Lipid Panel:  Recent Labs  Lab 07/31/21 1247  CHOL 118  TRIG 45  HDL 60  CHOLHDL 2.0  VLDL 9  LDLCALC 49    HgbA1c:  Recent Labs  Lab 07/31/21 0056  HGBA1C 6.2*    Urine Drug Screen:  Recent Labs  Lab 07/31/21 1020  LABOPIA POSITIVE*  COCAINSCRNUR NONE DETECTED  LABBENZ NONE DETECTED  AMPHETMU NONE DETECTED  THCU NONE DETECTED  LABBARB NONE DETECTED     Alcohol Level No results for input(s): ETH in the last 168 hours.  IMAGING past 24 hours No results found.  PHYSICAL EXAM  Temp:  [97.6 F (36.4 C)-98.5 F (36.9 C)] 98.5 F (36.9 C) (01/29 0328) Pulse Rate:  [73-84] 83 (01/29 0800) Resp:  [19-28] 23 (01/29 0800) BP: (108-138)/(73-104) 135/104 (01/29 0800) SpO2:  [92 %-100 %] 99 % (01/29 0800)  General - Well nourished, well developed, in no apparent distress.  Cardiovascular - Tachycardic (afib).  Mental Status -  Level of arousal and orientation to time, place, and person were intact. No aphasia. Speech is more clear today.  Cranial Nerves II - XII - II - Visual field intact OU. III, IV, VI - Extraocular movements intact. VII - Facial movement intact bilaterally. VIII - Hearing & vestibular intact  bilaterally.  XII - Tongue protrusion intact.  Motor Strength - LLE significantly weaker than right 2/5  LUE/RLE/RUE 5/5  Motor Tone - Muscle tone was assessed at the neck and appendages and was normal. Reflexes - The patients reflexes were symmetrical in all extremities and he had no pathological reflexes. Sensory - Reports difference in sensation to light touch in LLE but unable to specify.   Coordination - The patient had normal movements in the hands and feet with no ataxia or dysmetria.  Tremor was absent. Gait and Station - deferred.  ASSESSMENT/PLAN Mr. Ka Flammer is a 81 y.o. male with history of MI w/ ICD, CAD s/p stent, cerebellar stroke, HTN, HLD, PAF noncompliant with eliquis due to cost, BPH presenting with several days of left sided weakness. MRI pending due to ICD.   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 No occlusion or hemodynamically significant stenosis in the neck. Head MRI  pending Thoracic/Lumbar MRI pending 2D Echo EF 45-50% LDL 49 HgbA1c 6.2 VTE prophylaxis -  Eliquis No antithrombotic prior to admission, now on Eliquis 5mg  bid. Therapy recommendations: SNF Disposition:  pending  Hypertension Home meds:  diltiazem Avoid low BP given severe posterior circulation stenosis Long-term BP goal 1 30-1 50 given severe posterior circulation stenosis  Hyperlipidemia Home meds:  Lipitor 40 mg, resumed in hospital LDL 49, goal < 70 Continue statin at discharge  Other Stroke Risk Factors Advanced Age >/= 65  Coronary artery disease Congestive heart failure  VT/VF cardiac arrest 10/07/2018 STEMI w/ LAD occlusion- 3 drug eluting stents placed in LAD and a stent placed in the circumflex  Was on eliquis, brilinta, and ASA at  the time he was discharged and took the eliquis and brilinta for 30 days AICD conditional with MRI [facility] day # 1  Patient seen and examined by NP/APP with MD. MD to update note as needed.   Janine Ores, DNP, FNP-BC [facility] Pager: [phone]  ATTENDING ATTESTATION:  history of cardiac arrest w/ AICD, MI/CAD s/p stent, HTN, HLD, PAF noncompliant with eliquis admitted for left leg weakness for 10 days at [facility] then transferred to [facility]. Due to left leg weakness MRI brain/ c/t spine ordered by Dr. Erlinda Hong but delayed due to ICD-likely will happen on Monday.  He is having left hip pain when moving his left leg, strength is slightly better on today's exam.  On eliquis for stroke prevention  Dr. Reeves Forth evaluated pt independently, reviewed imaging, chart, labs. Discussed and formulated plan with the APP. Please see APP note above for details.   Total 36 minutes spent on counseling patient and coordinating care, writing notes and reviewing chart.   Yanis Larin,MD     To contact Stroke Continuity provider, please refer to [email or web address] After hours, contact General Neurology

## 2021-08-03 ENCOUNTER — Inpatient Hospital Stay (HOSPITAL_COMMUNITY): Payer: Medicare Other

## 2021-08-03 LAB — URINE CULTURE: Culture: NO GROWTH

## 2021-08-03 LAB — BASIC METABOLIC PANEL
Anion gap: 9 (ref 5–15)
BUN: 14 mg/dL (ref 8–23)
CO2: 25 mmol/L (ref 22–32)
Calcium: 8.6 mg/dL — ABNORMAL LOW (ref 8.9–10.3)
Chloride: 92 mmol/L — ABNORMAL LOW (ref 98–111)
Creatinine, Ser: 0.73 mg/dL (ref 0.61–1.24)
GFR, Estimated: >60 mL/min (ref >60)
Glucose, Bld: 129 mg/dL — ABNORMAL HIGH (ref 70–99)
Potassium: 4 mmol/L (ref 3.5–5.1)
Sodium: 126 mmol/L — ABNORMAL LOW (ref 135–145)

## 2021-08-03 LAB — OSMOLALITY, URINE: Osmolality, Ur: 545 mOsm/kg (ref 300–900)

## 2021-08-03 LAB — TSH: TSH: 1.231 u[IU]/mL (ref 0.350–4.500)

## 2021-08-03 LAB — CORTISOL-AM, BLOOD: Cortisol - AM: 16.9 ug/dL (ref 6.7–22.6)

## 2021-08-03 IMAGING — MR MR HEAD W/O CM
8 of 10 series · 34 of 48 positions shown · non-contrast
Comparison: CT angiogram head/neck [DATE].

CLINICAL DATA: Provided history: Dizziness, persistent/recurrent,
cardiac or vascular cause suspected. Stroke-like symptoms.
Additional history provided: Left leg weakness for 1 week, slurred
speech last [REDACTED], history of stroke.

EXAM:
MRI HEAD WITHOUT CONTRAST
TECHNIQUE: Multiplanar, multiecho pulse sequences of the brain and surrounding
structures were obtained without intravenous contrast.

[Series 3: DWI · axial · 3.0mm · 1.09mm/px · z∈[-72,+81]mm · 8 of 104 slices shown (1 of 4)]
[im 1/104]
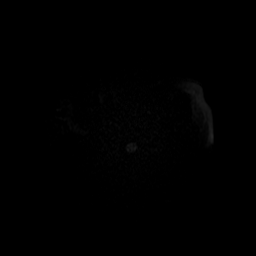
[im 12/104]
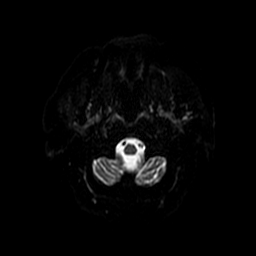
[im 35/104]
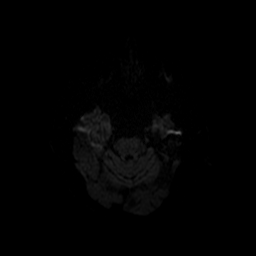
[im 46/104]
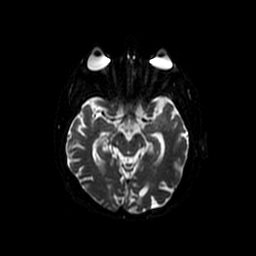
[im 58/104]
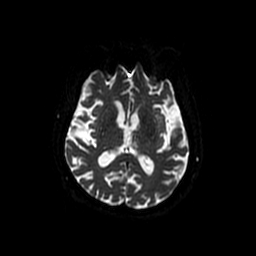
[im 69/104]
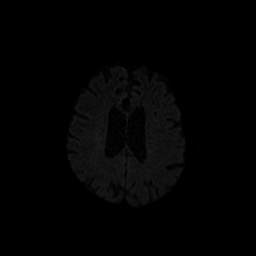
[im 92/104]
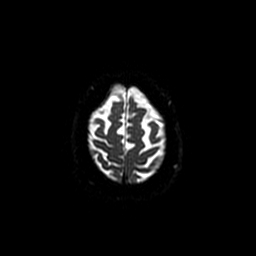
[im 104/104]
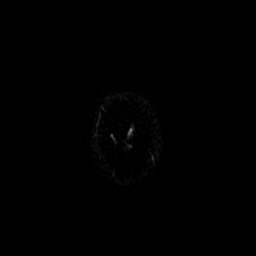

[Series 4: DWI · coronal · 5.0mm · 1.09mm/px · 7 of 72 slices shown (2 of 4)]
[im 1/72]
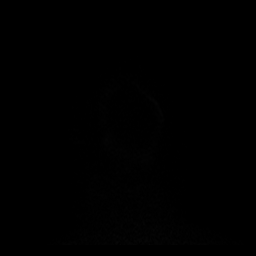
[im 12/72]
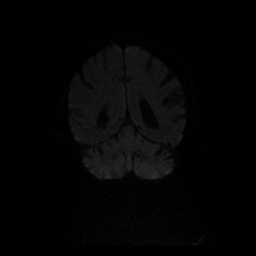
[im 24/72]
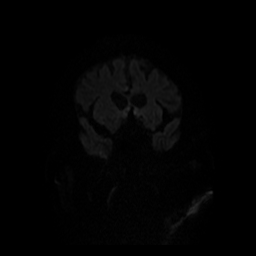
[im 36/72]
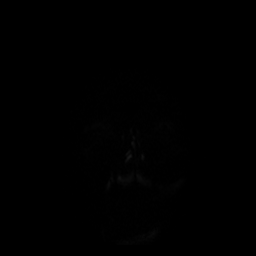
[im 48/72]
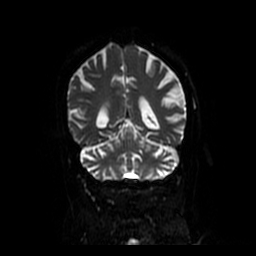
[im 60/72]
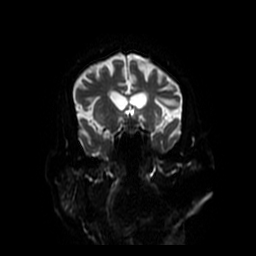
[im 72/72]
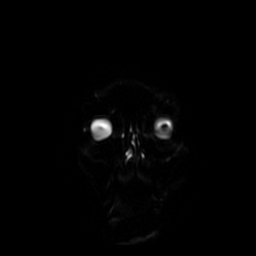

[Series 5: T1 · sagittal · 5.0mm · 0.47mm/px · 3 of 27 slices shown]
[im 1/27]
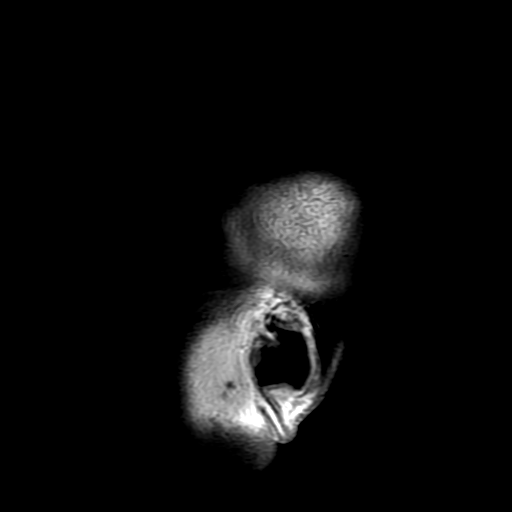
[im 14/27]
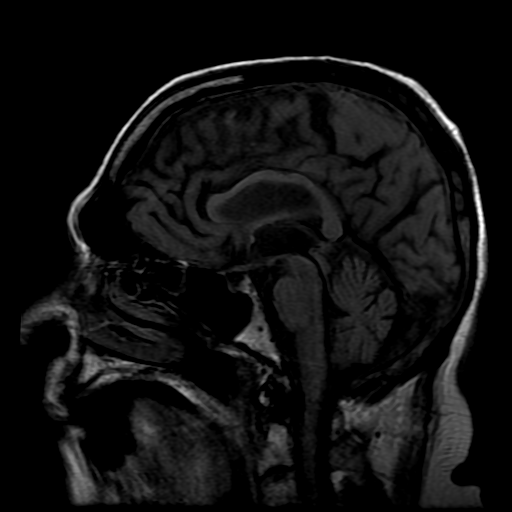
[im 27/27]
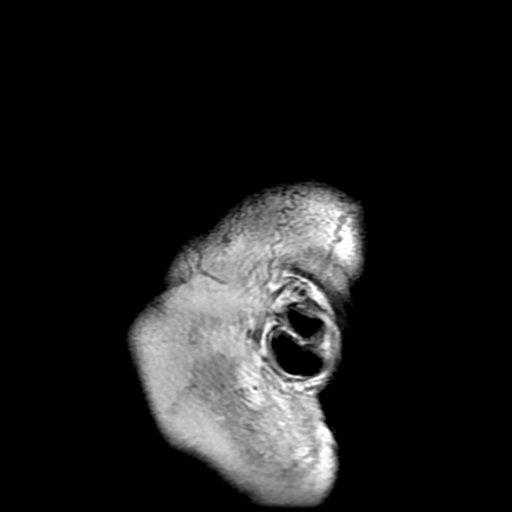

[Series 6: T2 · axial · 5.0mm · 0.45mm/px · z∈[-74,+82]mm · 3 of 27 slices shown (1 of 2)]
[im 1/27]
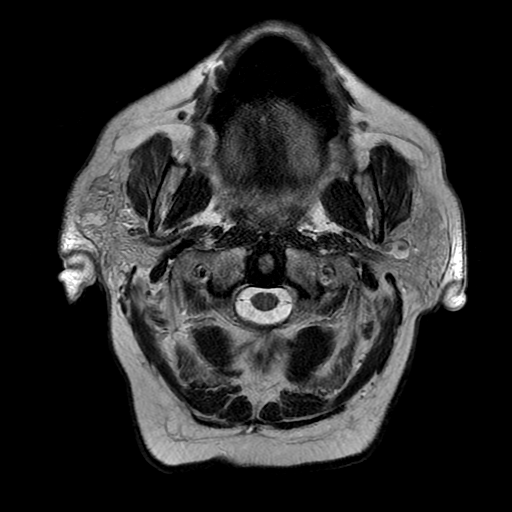
[im 14/27]
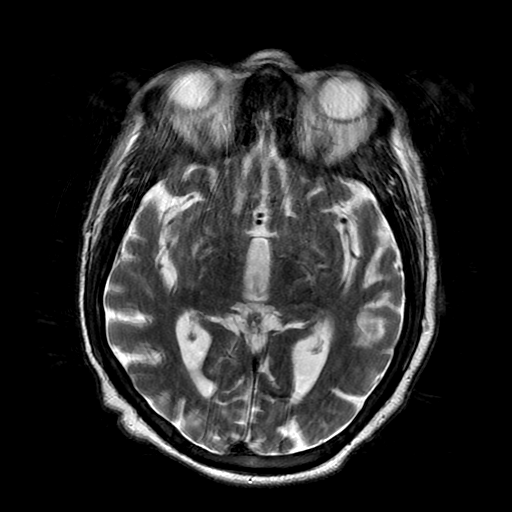
[im 27/27]
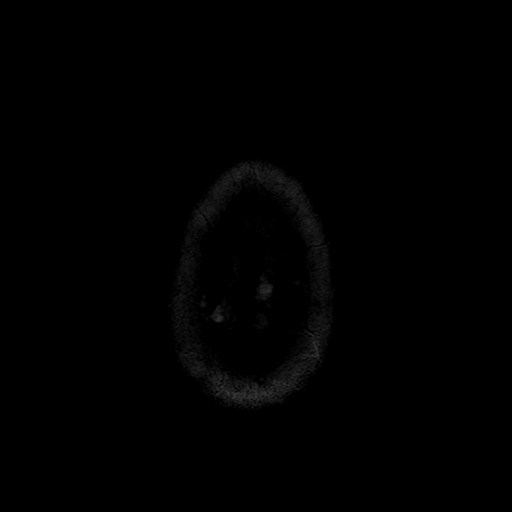

[Series 8: FLAIR · axial · 3.0mm · 0.45mm/px · z∈[-68,+82]mm · 2 of 26 slices shown]
[im 1/26]
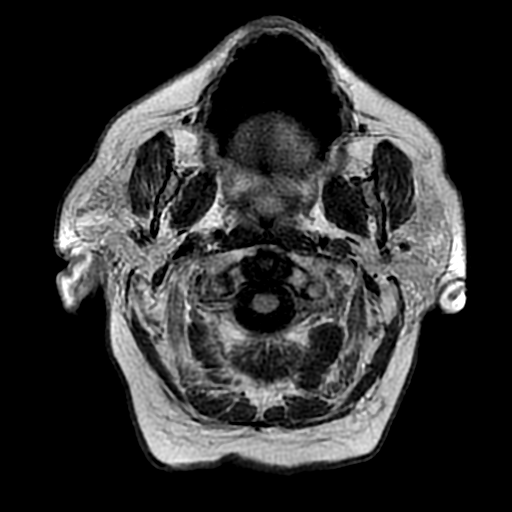
[im 26/26]
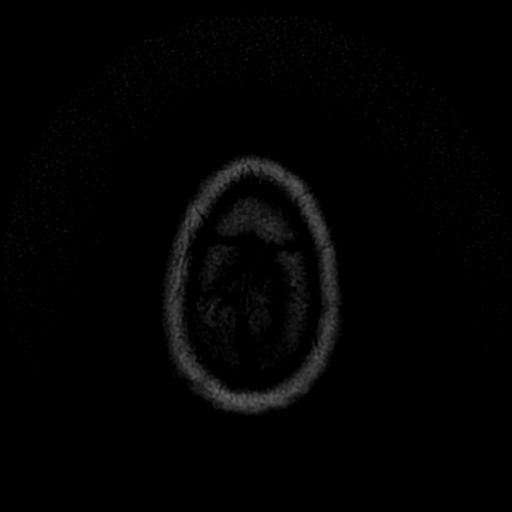

[Series 10: T2 · coronal · 5.0mm · 0.39mm/px · 3 of 28 slices shown (2 of 2)]
[im 1/28]
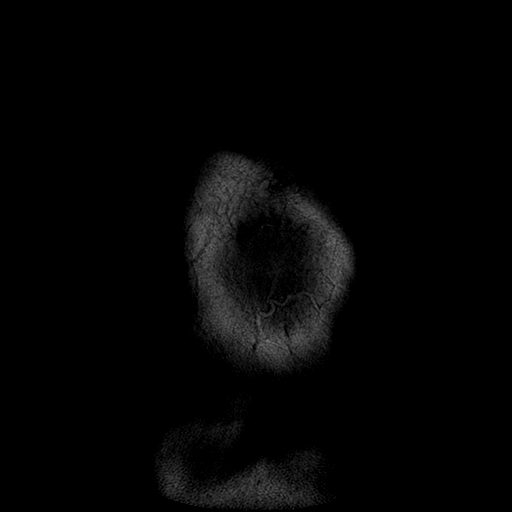
[im 14/28]
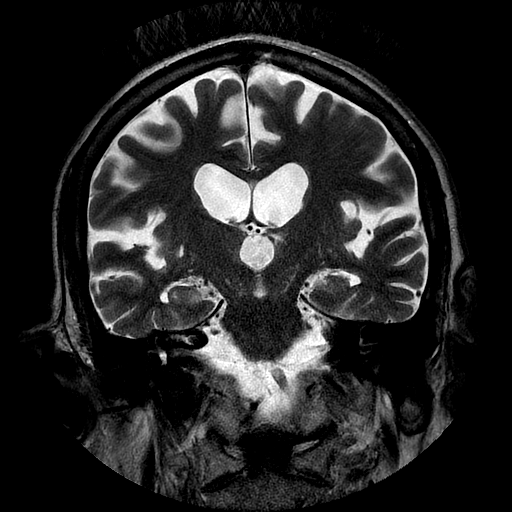
[im 28/28]
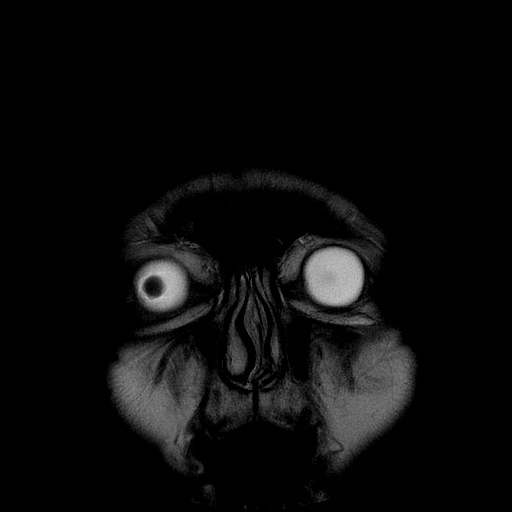

[Series 300: DWI · axial · 3.0mm · 1.09mm/px · z∈[-72,+81]mm · 5 of 52 slices shown (3 of 4)]
[im 1/52]
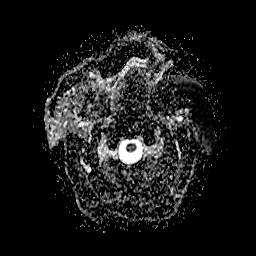
[im 13/52]
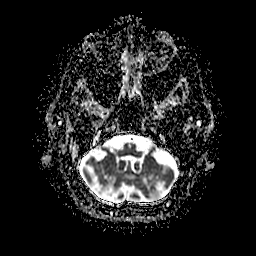
[im 26/52]
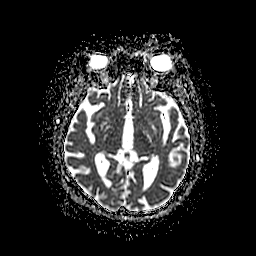
[im 39/52]
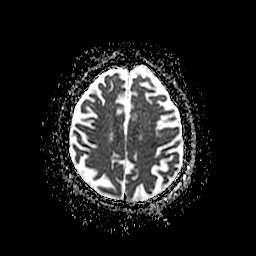
[im 52/52]
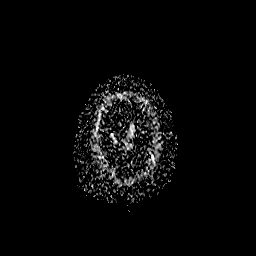

[Series 400: DWI · coronal · 5.0mm · 1.09mm/px · 3 of 36 slices shown (4 of 4)]
[im 1/36]
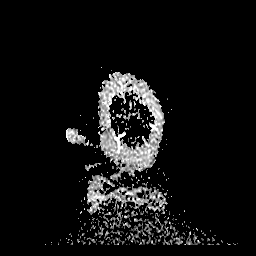
[im 18/36]
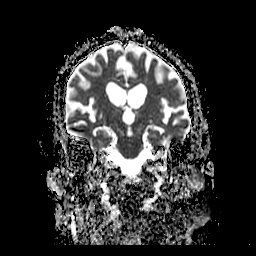
[im 36/36]
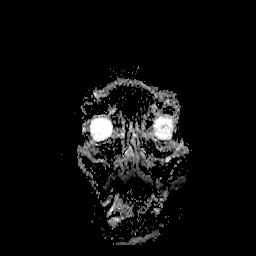

[34 of 48 positions shown; findings below may reference images not displayed]

FINDINGS: Brain:

The examination is intermittently motion degraded, limiting
evaluation. Most notably, there is moderate motion degradation of
the axial T2 TSE sequence and moderate/severe motion a shin of the
axial T1 weighted sequence.

Mild-to-moderate generalized cerebral and cerebellar atrophy.

Small chronic cortical/subcortical infarct within the left occipital
lobe.

Mild scattered and ill-defined T2 FLAIR hyperintense signal
abnormality elsewhere within the cerebral white matter, nonspecific
but compatible chronic small vessel ischemic disease.

Numerous chronic infarcts within the bilateral cerebellar
hemispheres.

There is no acute infarct.

No evidence of an intracranial mass.

No chronic intracranial blood products.

No extra-axial fluid collection.

No midline shift.

Vascular: Signal abnormality within the V4 left vertebral artery
corresponding with the occlusion versus severe stenosis demonstrated
at this site on the CTA head/neck of [DATE].

Skull and upper cervical spine: No focal suspicious marrow lesion.

Sinuses/Orbits: Visualized orbits show no acute finding. Trace
mucosal thickening versus fluid within the bilateral ethmoid air
cells. Small mucous retention cyst within the inferior left
maxillary sinus.

Other: Small right mastoid effusion. Trace fluid also present within
the left mastoid air cells.
IMPRESSION: 1. Motion degraded examination, as described.
2. The diffusion-weighted imaging is of good quality. No evidence of
acute infarction.
3. No acute intracranial abnormality is identified.
4. Small chronic cortical/subcortical infarct within the left
occipital lobe (PCA vascular territory).
5. Background mild chronic small-vessel ischemic changes within the
cerebral white matter.
6. Numerous chronic infarcts within the bilateral cerebellar
hemispheres.
7. Mild-to-moderate generalized cerebral and cerebellar atrophy.
8. Signal abnormality within the V4 left vertebral artery
corresponding with the occlusion versus severe stenosis demonstrated
at this site on the CTA head/neck of [DATE].
9. Small right mastoid effusion.

## 2021-08-03 IMAGING — MR MR LUMBAR SPINE WO/W CM
4 of 8 series · 18 of 48 positions shown · IV contrast (10    MULTI)
Comparison: CT lumbar spine [DATE].

CLINICAL DATA: Spinal stenosis, lumbar

EXAM:
MRI LUMBAR SPINE WITHOUT AND WITH CONTRAST
TECHNIQUE: Multiplanar and multiecho pulse sequences of the lumbar spine were
obtained without and with intravenous contrast.
CONTRAST:  10mL GADAVIST GADOBUTROL 1 MMOL/ML IV SOLN

[Series 3: T2 · sagittal · 4.0mm · 0.55mm/px · 3 of 18 slices shown (1 of 2)]
[im 1/18]
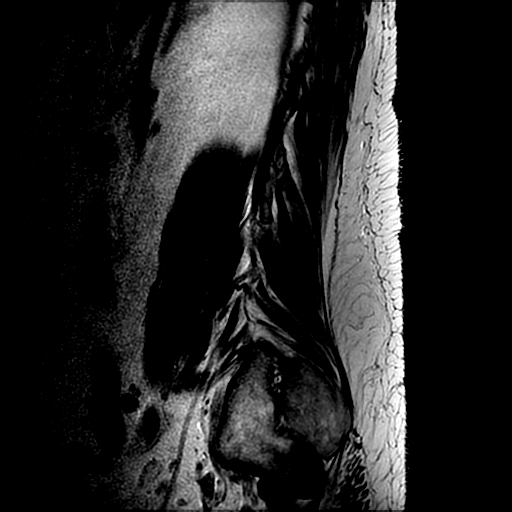
[im 9/18]
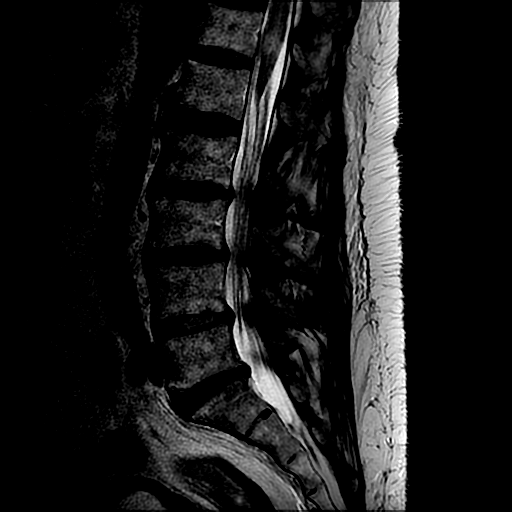
[im 18/18]
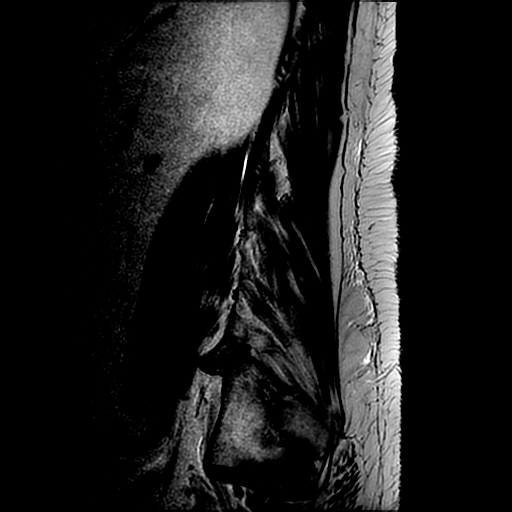

[Series 5: T1 · sagittal · 4.0mm · 0.55mm/px · 3 of 18 slices shown (1 of 2)]
[im 1/18]
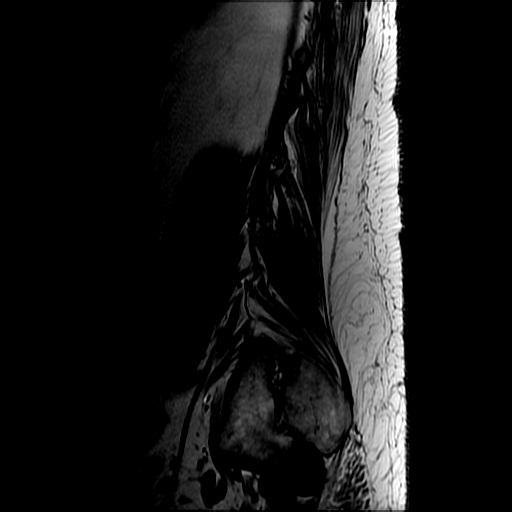
[im 12/18]
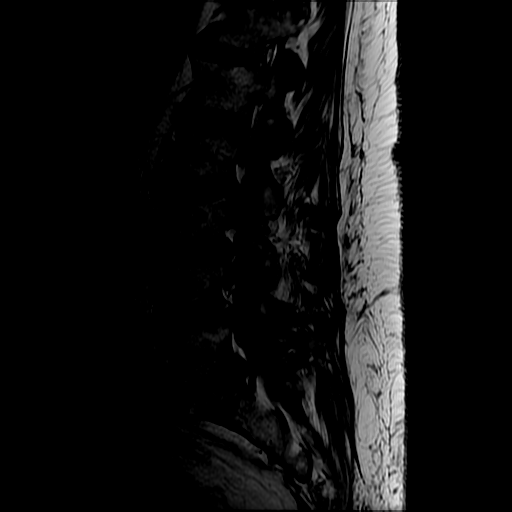
[im 18/18]
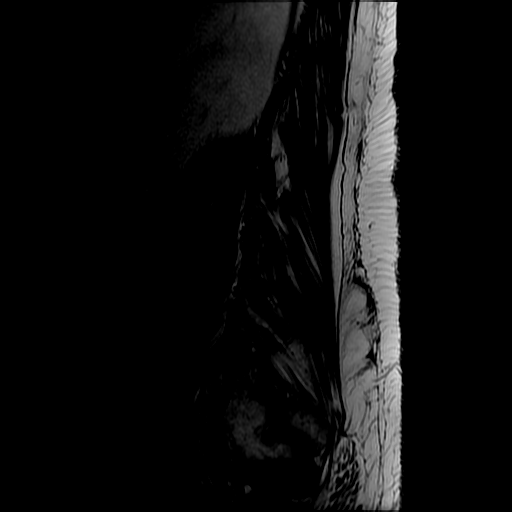

[Series 6: T2 · axial · 4.0mm · 0.39mm/px · z∈[-705,-488]mm · 9 of 40 slices shown (2 of 2)]
[im 1/40]
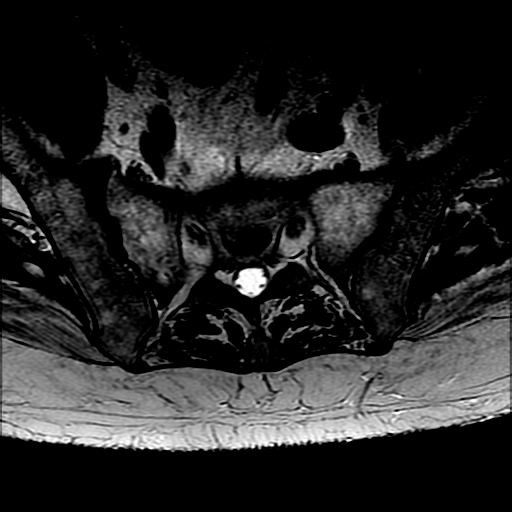
[im 5/40]
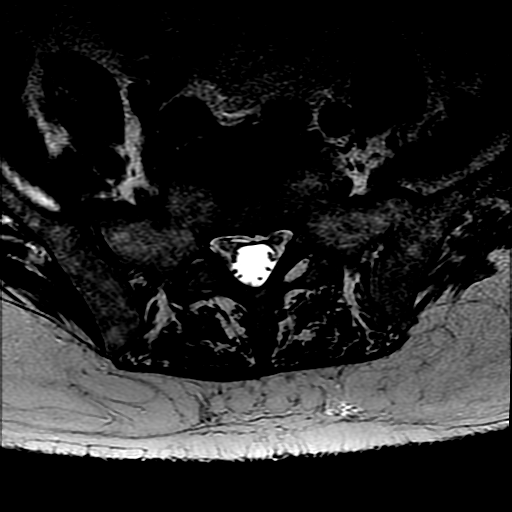
[im 10/40]
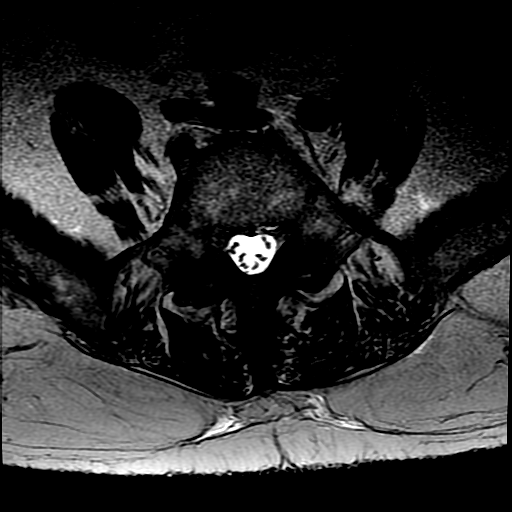
[im 15/40]
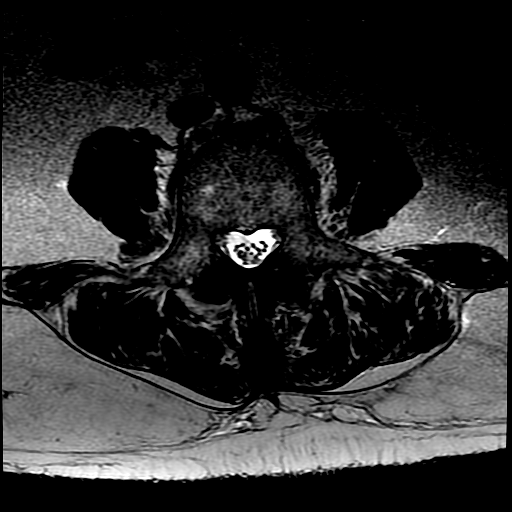
[im 20/40]
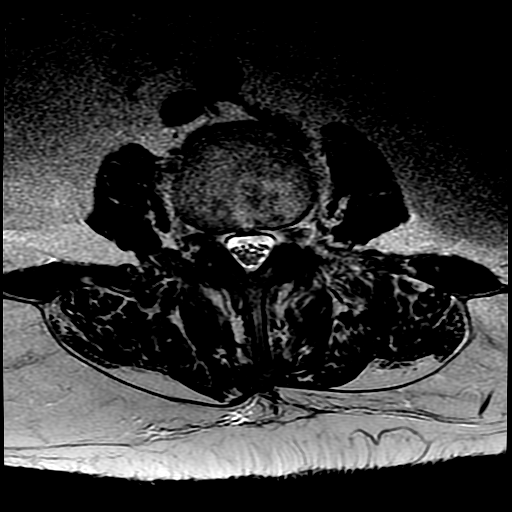
[im 25/40]
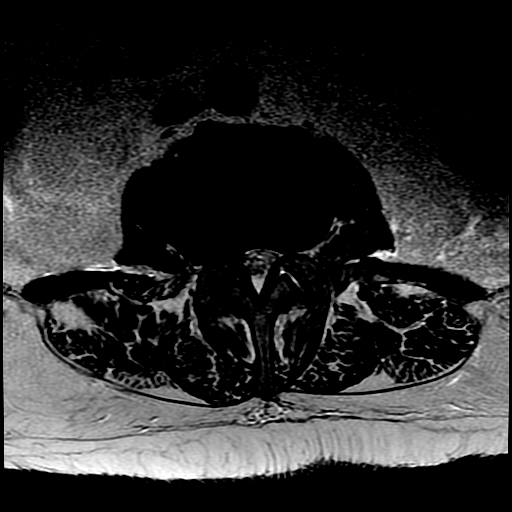
[im 30/40]
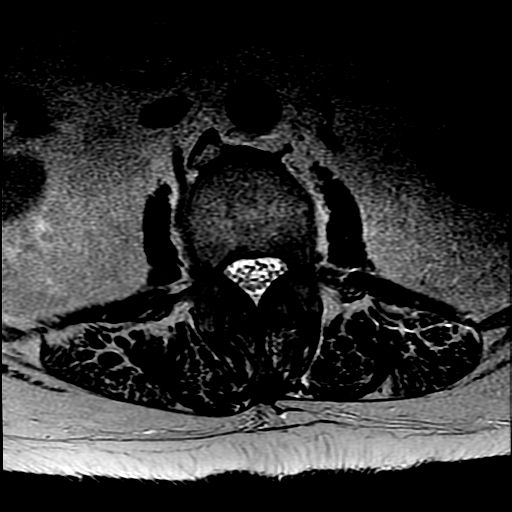
[im 35/40]
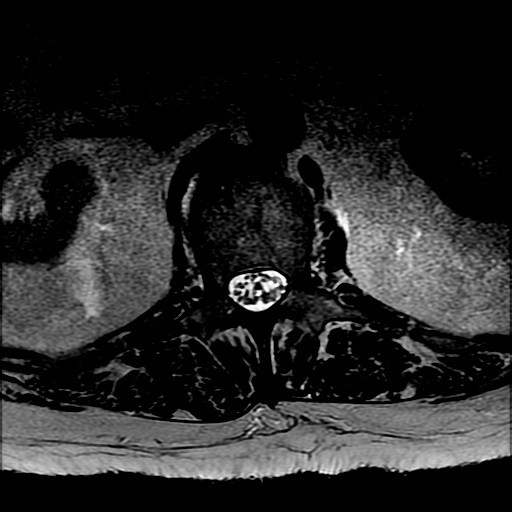
[im 40/40]
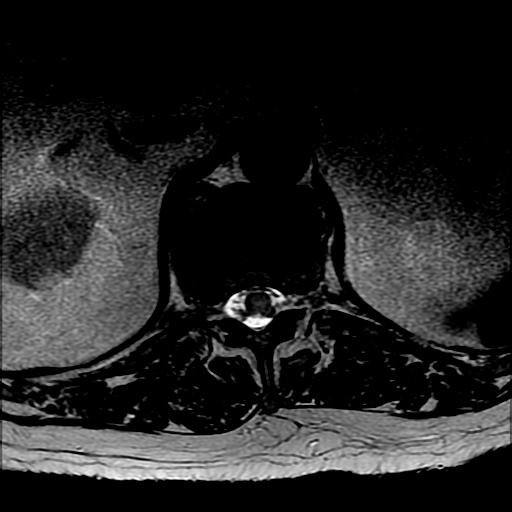

[Series 7: T1 · axial · 4.0mm · 0.39mm/px · z∈[-686,-513]mm · 3 of 40 slices shown (2 of 2)]
[im 5/40]
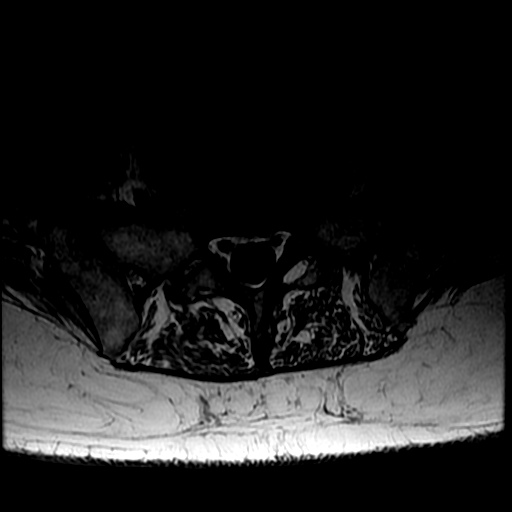
[im 20/40]
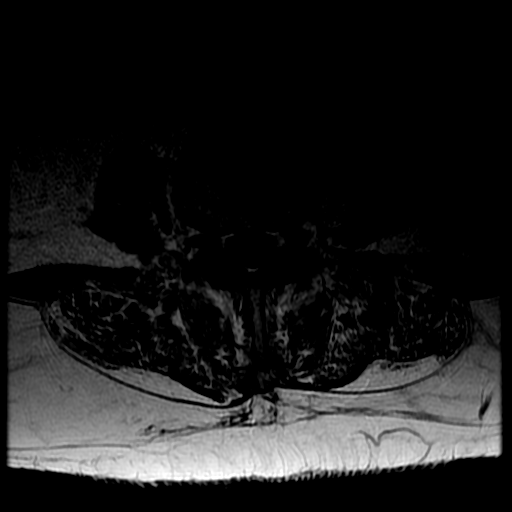
[im 35/40]
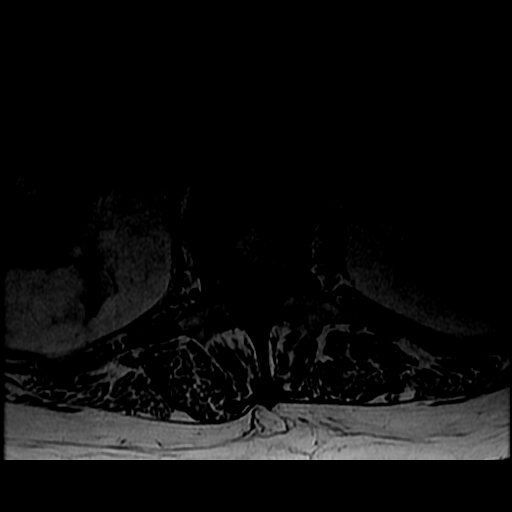

[18 of 48 positions shown; findings below may reference images not displayed]

FINDINGS: Segmentation:  Standard.

Alignment:  Normal.

Vertebrae: Mild degenerative/discogenic endplate signal changes at
L3-L4 and L4-L5 posteriorly with Schmorl's nodes. Otherwise, no
focal marrow edema chest acute fracture discitis/osteomyelitis.
Mildly heterogeneous bone marrow without suspicious bone lesion.

Conus medullaris and cauda equina: Conus extends to the L1 level.
Abnormal T2 hyperintense signal within the conus. Patchy enhancement
in the conus and areas of the lower thoracic cord, which appears to
be predominantly pial. There is also abnormal diffuse enhancement of
the cauda quinine nerve roots.

Paraspinal and other soft tissues: Unremarkable.

Disc levels:

T12-L1: No significant disc protrusion, foraminal stenosis, or canal
stenosis.

L1-L2: Mild disc bulging and facet arthropathy without significant
stenosis.

L2-L3: Disc bulging, eccentric to the right. Right greater than left
facet arthropathy. Resulting moderate right and mild left foraminal
stenosis. Mild canal stenosis with right greater than left
subarticular recess narrowing.

L3-L4: Disc bulging and endplate spurring. Bilateral facet
arthropathy. Ligamentum flavum thickening. Resulting moderate
bilateral foraminal stenosis and moderate canal stenosis with
bilateral subarticular recess narrowing.

L4-L5: Right eccentric disc bulge. Moderate bilateral facet
arthropathy. Ligamentum flavum thickening. Resulting moderate right
greater than left foraminal stenosis and mild canal stenosis with
right greater than left subarticular recess narrowing.

L5-S1: Right eccentric disc bulge with bilateral facet arthropathy.
Resulting severe right and moderate left foraminal stenosis. No
significant canal stenosis. Narrowing of the right subarticular
recess.
IMPRESSION: 1. Abnormal signal within the conus with extensive abnormal
enhancement in the conus, visualized lower thoracic cord, and the
cauda equina nerve roots diffusely. Differential considerations
include malignancy (including CSF disseminated metastases),
inflammation (including neurosarcoidosis, although there were no
findings to suggest sarcoidosis on recent CT chest), infection
(including atypical etiologies), and demyelination (including
VAQUERA Syndrome). Multiple sclerosis is thought less likely
given patient age/sex and extensive leptomeningeal/cauda equina
enhancement. Recommend correlation with lumbar puncture. Also,
postcontrast imaging of the brain may be worthwhile to evaluate for
abnormal intracranial enhancement.
2. At L5-S1, severe right and moderate left foraminal stenosis.
3. At L3-L4, moderate canal and bilateral foraminal stenosis.
4. At L4-L5 moderate right greater than left foraminal stenosis and
mild canal stenosis.
5. At L2-L3, moderate right and mild left foraminal stenosis.

## 2021-08-03 MED ORDER — LORAZEPAM 0.5 MG PO TABS
0.5000 mg | ORAL_TABLET | Freq: Once | ORAL | Status: AC
  Administered 2021-08-03: 0.5 mg via ORAL
  Filled 2021-08-03: qty 1

## 2021-08-03 MED ORDER — GADOBUTROL 1 MMOL/ML IV SOLN
10.0000 mL | Freq: Once | INTRAVENOUS | Status: AC | PRN
  Administered 2021-08-03: 10 mL via INTRAVENOUS

## 2021-08-03 NOTE — Plan of Care (Signed)
°  Problem: Education: Goal: Knowledge of General Education information will improve Description: Including pain rating scale, medication(s)/side effects and non-pharmacologic comfort measures Outcome: Progressing   Problem: Clinical Measurements: Goal: Ability to maintain clinical measurements within normal limits will improve Outcome: Progressing Goal: Will remain free from infection Outcome: Progressing Goal: Cardiovascular complication will be avoided Outcome: Progressing   Problem: Coping: Goal: Level of anxiety will decrease Outcome: Progressing   Problem: Pain Managment: Goal: General experience of comfort will improve Outcome: Progressing   Problem: Safety: Goal: Ability to remain free from injury will improve Outcome: Progressing

## 2021-08-03 NOTE — Progress Notes (Signed)
FPTS Brief Progress Note  S: Patient is resting comfortably on evaluation.  Did not wake him.   O: BP 125/66 (BP Location: Left Arm)    Pulse 96    Temp 98.3 F (36.8 C) (Oral)    Resp 20    SpO2 100%   General: Sleeping comfortably in no acute distress Cardiac: Regular rate  A/P: Plan to continue current plan from daily progress note.  MRI showing cauda equina.  Neurology following.  Patient will most likely need lumbar puncture but will await neurology's recommendations tomorrow.  - Orders reviewed. Labs for AM ordered, which was adjusted as needed.   Gifford Shave, MD 08/03/2021, 8:27 PM PGY-3, [place] Night Resident  Please page [phone] with questions.

## 2021-08-03 NOTE — Progress Notes (Addendum)
STROKE TEAM PROGRESS NOTE   INTERVAL HISTORY Patient seen and assessed at bedside. Patient proximal LLE weakness improving but continues to be very weak more distally. Remainder of neuro exam largely unchanged. Discussed having MRI of spine and brain to assess whether neurological deficits are due to stroke or spine.   Vitals:   08/03/21 0335 08/03/21 0736 08/03/21 1000 08/03/21 1146  BP: 103/79 119/86  97/82  Pulse: 84 (!) 177  79  Resp: 18 20  17   Temp: 98.7 F (37.1 C) 98 F (36.7 C)  98.2 F (36.8 C)  TempSrc: Oral Axillary  Oral  SpO2: 99% 100% 95% 95%   CBC:  Recent Labs  Lab 07/31/21 0056 08/01/21 0616  WBC 15.7* 12.5*  HGB 13.8 14.9  HCT 40.7 44.1  MCV 85.7 84.5  PLT 338 244    Basic Metabolic Panel:  Recent Labs  Lab 08/02/21 0230 08/03/21 0028  NA 126* 126*  K 3.8 4.0  CL 91* 92*  CO2 25 25  GLUCOSE 130* 129*  BUN 14 14  CREATININE 0.72 0.73  CALCIUM 8.6* 8.6*    Lipid Panel:  Recent Labs  Lab 07/31/21 1247  CHOL 118  TRIG 45  HDL 60  CHOLHDL 2.0  VLDL 9  LDLCALC 49    HgbA1c:  Recent Labs  Lab 07/31/21 0056  HGBA1C 6.2*    Urine Drug Screen:  Recent Labs  Lab 07/31/21 1020  LABOPIA POSITIVE*  COCAINSCRNUR NONE DETECTED  LABBENZ NONE DETECTED  AMPHETMU NONE DETECTED  THCU NONE DETECTED  LABBARB NONE DETECTED     Alcohol Level No results for input(s): ETH in the last 168 hours.  IMAGING past 24 hours DG Pelvis Portable  Result Date: 08/02/2021 CLINICAL DATA:  Weakness. Pelvis and hip pain. EXAM: PORTABLE PELVIS 1-2 VIEWS COMPARISON:  None. FINDINGS: There is no evidence of pelvic fracture or diastasis. No pelvic bone lesions are seen. Hips are unremarkable in appearance on this one view pelvis radiograph. Lower lumbar spine degenerative changes are noted. IMPRESSION: No acute findings. Electronically Signed   By: Marlaine Hind M.D.   On: 08/02/2021 17:18    PHYSICAL EXAM  Temp:  [98 F (36.7 C)-98.7 F (37.1 C)] 98.2 F  (36.8 C) (01/30 1146) Pulse Rate:  [79-177] 79 (01/30 1146) Resp:  [17-22] 17 (01/30 1146) BP: (97-128)/(79-87) 97/82 (01/30 1146) SpO2:  [95 %-100 %] 95 % (01/30 1146)  General - Well nourished, well developed pleasant elderly male, in no apparent distress.  Cardiovascular - Tachycardic (afib).  Mental Status -  Level of arousal and orientation to time, place, and person were intact. No aphasia. Speech is more clear today.  Cranial Nerves II - XII - II - Visual field intact OU. III, IV, VI - Extraocular movements intact. VII - Facial movement intact bilaterally. VIII - Hearing & vestibular intact bilaterally.  XII - Tongue protrusion intact.  Motor Strength - LLE significantly weaker than right 2/5 proximally and 0/5 distally.  LUE/RLE/RUE 5/5  Motor Tone - Muscle tone was assessed at the neck and appendages and was normal. Reflexes - The patients reflexes were symmetrical in all extremities and he had no pathological reflexes. Sensory - Reports difference in sensation to light touch in LLE but unable to specify.   Coordination - The patient had normal movements in the hands and feet with no ataxia or dysmetria.  Tremor was absent. Gait and Station - deferred.  ASSESSMENT/PLAN Ronald Sheppard is a 81 y.o. male with history  of MI w/ ICD, CAD s/p stent, cerebellar stroke, HTN, HLD, PAF noncompliant with eliquis due to cost, BPH presenting with several days of left sided weakness. MRI pending due to ICD.   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 No occlusion or hemodynamically significant stenosis in the neck. Head MRI  pending Thoracic/Lumbar MRI pending 2D Echo EF 45-50% LDL 49 HgbA1c 6.2 VTE prophylaxis - Eliquis No antithrombotic  prior to admission, now on Eliquis 5mg  bid. Therapy recommendations: SNF Disposition:  pending  Hypertension Home meds:  diltiazem Avoid low BP given severe posterior circulation stenosis Long-term BP goal 1 30-1 50 given severe posterior circulation stenosis  Hyperlipidemia Home meds:  Lipitor 40 mg, resumed in hospital LDL 49, goal < 70 Continue statin at discharge  Other Stroke Risk Factors Advanced Age >/= 54  Coronary artery disease Congestive heart failure  VT/VF cardiac arrest 10/07/2018 STEMI w/ LAD occlusion- 3 drug eluting stents placed in LAD and a stent placed in the circumflex  Was on eliquis, brilinta, and ASA at the time he was discharged and took the eliquis and brilinta for 30 days AICD conditional with MRI [facility] day # 2  France Ravens, MD PGY1 Resident  I have personally obtained history,examined this patient, reviewed notes, independently viewed imaging studies, participated in medical decision making and plan of care.ROS completed by me personally and pertinent positives fully documented  I have made any additions or clarifications directly to the above note. Agree with note above.  Patient presented with sudden left leg weakness but denies any trauma, fall, back pain or radicular pain.  Denies any associated symptoms of upper extremity involvement or face involvement.  Recommend check MRI of the brain as well as thoracic and lumbar spine to rule out compressive spinal lesion.  He will need the [employer] rep to reprogram his AICD prior to the MRI.  Continue ongoing stroke work-up.  Greater than 50% time during this 35-minute visit was spent on counseling and coordination of care and discussion with patient and care team and answering questions.  Antony Contras, MD Medical Director [place] Pager: [phone] 08/03/2021 4:37 PM   To contact Stroke Continuity provider, please refer to [email or web address] After hours,  contact General Neurology

## 2021-08-03 NOTE — Progress Notes (Signed)
PT Cancellation Note  Patient Details Name: Ronald Sheppard MRN: [id number] DOB: 04-13-1941   Cancelled Treatment:    Reason Eval/Treat Not Completed: Patient at procedure or test/unavailable. Two attempts were made but pt is still gone to MRI.  Retry as time and pt allow.   Ramond Dial 08/03/2021, 5:13 PM  Mee Hives, PT PhD Acute Rehab Dept. Number: [place] [facility] [place]

## 2021-08-03 NOTE — Progress Notes (Signed)
FPTS Interim Progress Note  Discussed with patient's daughter Germaine Pomfret and updated her about MRI results.  Discussed that it showed cauda equina abnormalities/stenosis with multiple possible differentials.  Discussed that neurology is following and will put in the recommendations tomorrow and then our team will be in communication with her about plan.  All questions answered regarding imaging/plan.  Gerrit Heck, MD 08/03/2021, 8:16 PM PGY-1, [place] [facility] pager [phone]

## 2021-08-03 NOTE — Progress Notes (Signed)
Per order, Changed device settings for MRI to  ODO (pacing off), MRI mode/Tachy-therapies to off  Will program device back to pre-MRI settings after completion of exam, and send transmission

## 2021-08-03 NOTE — Progress Notes (Signed)
Called and updated daughter Germaine Pomfret [phone]. 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 Discussed MRI happening this afternoon, and a member of our team will call and update them with the results.  Yehuda Savannah MD

## 2021-08-03 NOTE — TOC Progression Note (Signed)
Transition of Care [facility]) - Progression Note    Patient Details  Name: Ronald Sheppard MRN: [id number] Date of Birth: January 19, 1941  [facility] CM/SW [place], LCSW Phone Number: 08/03/2021, 2:11 PM  Clinical Narrative:    CSW spoke with [facility] and they reported being able to accept patient once medically stable.    Expected Discharge Plan: [place] Barriers to Discharge: Continued Medical Work up, Ship broker  Expected Discharge Plan and Services Expected Discharge Plan: [place] In-house Referral: Clinical Social Work     Living arrangements for the past 2 months: Single Family Home                                       Social Determinants of Health (SDOH) Interventions    Readmission Risk Interventions No flowsheet data found.

## 2021-08-03 NOTE — Progress Notes (Addendum)
Family Medicine Teaching Service Daily Progress Note Intern Pager: [phone]  Patient name: Ronald Sheppard Medical record number: [id number] Date of birth: 1940-10-12 Age: 81 y.o. Gender: male  Primary Care Provider: Raina Mina., MD Consultants: Neurology, Urology Code Status: Full  Pt Overview and Major Events to Date:  07/30/21 - Patient admitted 08/01/21 - Foley exchanged  Assessment and Plan:  Ronald Sheppard is a 81 y.o. male presenting with fall. PMH is significant for MI w/ pacemake and defibrillator CAD s/p stent, cerebellar stroke, HTN, HLD, PAF, BPH   Fall   Concern for Subacute Stroke vs Cauda Equina syndrome Patient to get MRI Ronald Sheppard, Thoracic spine and Lumbar spine today to evaluate for stroke vs cauda equina syndrome. Patient defibrillator needing to be turned off for procedure.   -PT/OT eval and treat -F/u MRI brain, thoracic spine, lumbar spine -Continue eliquis 5 mg BID -Stroke team following, appreciate recs  Urinary retention   lesion on glans Urology saying no need for abx, and to keep catheter in, with no tension on the tube. Can use bacitracin ointment on lesion. Continue foley catheter until imaging resulted. Will consider voiding trial with post void residual bladder scans if. Poor UOP yesterday and day prior, will continue to monitor. -Continue flomax 0.4 mg daily -Continue foley catheter   Hyponatremia Na today 126, stable from yesterday.  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  -1500 cc fluid restriction. -Consider salt tablets -AM BMP  Hip pain Hip X-ray was benign with no fracture  HTN BP range 100-120/70-80's, stable. Long term target BP range 130-150 per neurology.  -Continue cardizem 120 mg daily -Continue to monitor  Hx of Cerebellar stroke -Continue fall precautions  Hx of MI   CAD with stents MI in 2020 w/ stent in LAD  an CX and defibrillator placed.  -Continue  ASA 81 -Continue cardiac monitoring  Hx of PAF on Eliquis -Continue cardiac monitoring -Continue diltazem 120 mg daily -Continue eliquis 5 mg BID  HLD Continue atorvastatin 40 mg dialy  FEN/GI: Carb modified PPx: Eliquis Dispo:SNF in 2-3 days.    Subjective:  Feeling reports he's feeling well, but is still discouraged about his LLE deficit. Also eager for imaging today.  Objective: Temp:  [98.3 F (36.8 C)-98.7 F (37.1 C)] 98.7 F (37.1 C) (01/30 0335) Pulse Rate:  [75-86] 84 (01/30 0335) Resp:  [18-24] 18 (01/30 0335) BP: (101-135)/(79-104) 103/79 (01/30 0335) SpO2:  [99 %-100 %] 99 % (01/30 0335) Physical Exam: General: Well appearing, polite, conversational, NAD, white male Cardiovascular: RRR, [place] Respiratory: CTABL Abdomen: Soft, NTTP, non-distended Extremities: No edema, LLE 2/5 strength, strength preserved at 5/5 in all other extremities  Laboratory: Recent Labs  Lab 07/30/21 1236 07/31/21 0056 08/01/21 0616  WBC 14.2* 15.7* 12.5*  HGB 13.6 13.8 14.9  HCT 41.4 40.7 44.1  PLT 346 338 283   Recent Labs  Lab 08/01/21 0616 08/02/21 0230 08/03/21 0028  NA 128* 126* 126*  K 4.5 3.8 4.0  CL 94* 91* 92*  CO2 21* 25 25  BUN 12 14 14   CREATININE 0.67 0.72 0.73  CALCIUM 8.6* 8.6* 8.6*  GLUCOSE 121* 130* 129*      Imaging/Diagnostic Tests:   Ronald Bouche, MD 08/03/2021, 6:12 AM PGY-1, [place] Intern pager: [phone], text pages welcome

## 2021-08-04 ENCOUNTER — Inpatient Hospital Stay (HOSPITAL_COMMUNITY): Payer: Medicare Other

## 2021-08-04 DIAGNOSIS — G834 Cauda equina syndrome: Secondary | ICD-10-CM

## 2021-08-04 LAB — BASIC METABOLIC PANEL
Anion gap: 13 (ref 5–15)
BUN: 13 mg/dL (ref 8–23)
CO2: 25 mmol/L (ref 22–32)
Calcium: 8.9 mg/dL (ref 8.9–10.3)
Chloride: 89 mmol/L — ABNORMAL LOW (ref 98–111)
Creatinine, Ser: 0.71 mg/dL (ref 0.61–1.24)
GFR, Estimated: >60 mL/min (ref >60)
Glucose, Bld: 138 mg/dL — ABNORMAL HIGH (ref 70–99)
Potassium: 3.8 mmol/L (ref 3.5–5.1)
Sodium: 127 mmol/L — ABNORMAL LOW (ref 135–145)

## 2021-08-04 LAB — PSA: Prostatic Specific Antigen: 12.22 ng/mL — ABNORMAL HIGH (ref 0.00–4.00)

## 2021-08-04 LAB — AEROBIC CULTURE W GRAM STAIN (SUPERFICIAL SPECIMEN)

## 2021-08-04 IMAGING — CT CT CHEST-ABD-PELV W/ CM
3 of 5 series · 14 of 36 positions shown, 16 images · IV contrast (agent unspecified)
Comparison: CT chest done on [DATE]

CLINICAL DATA: Shortness of breath, evaluate for metastatic disease

EXAM:
CT CHEST, ABDOMEN, AND PELVIS WITH CONTRAST
TECHNIQUE: Multidetector CT imaging of the chest, abdomen and pelvis was
performed following the standard protocol during bolus
administration of intravenous contrast.

[Series 3: cap 5.0 i31f 2 · axial · 0.98mm/px · z∈[+954,+1499]mm · 9 of 137 slices shown, 11 images]
[im 14/137  mediastinal]
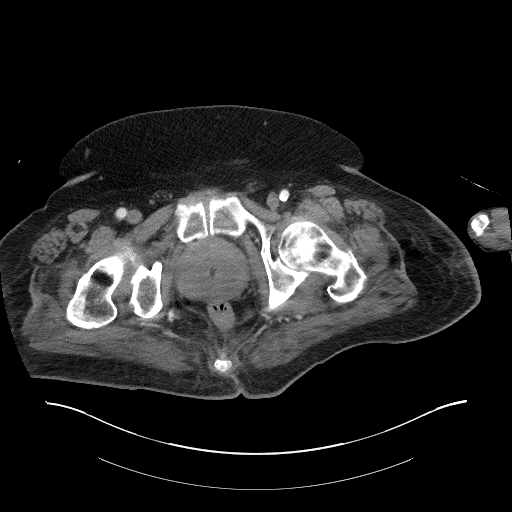
[im 14/137  bone]
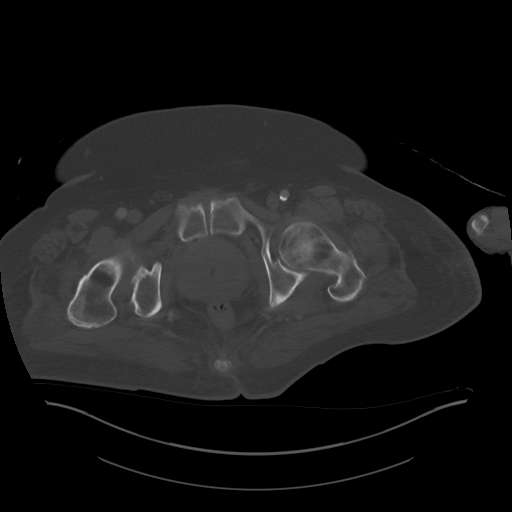
[im 28/137  mediastinal]
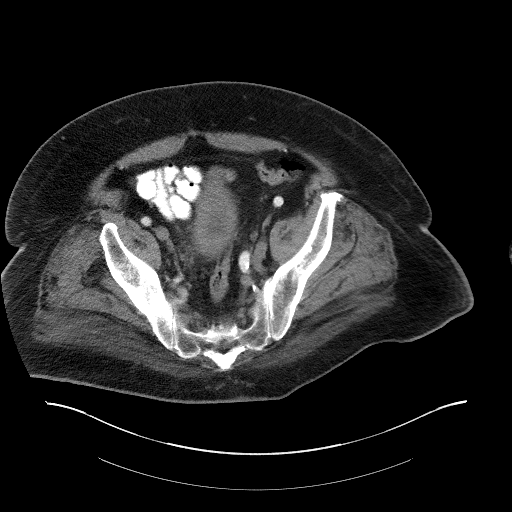
[im 41/137  mediastinal]
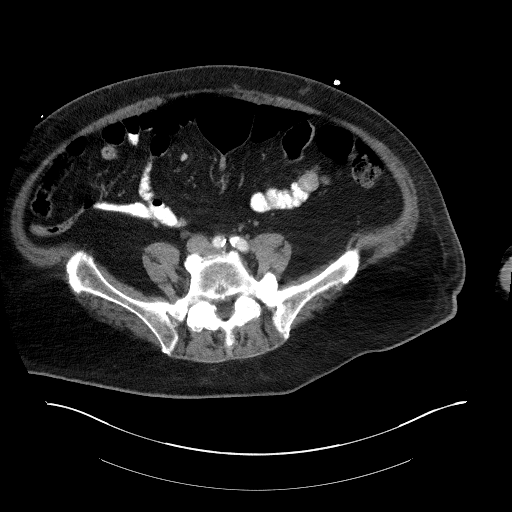
[im 55/137  mediastinal]
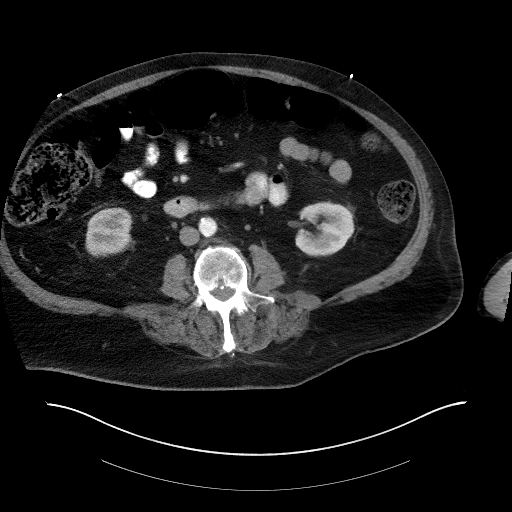
[im 69/137  mediastinal]
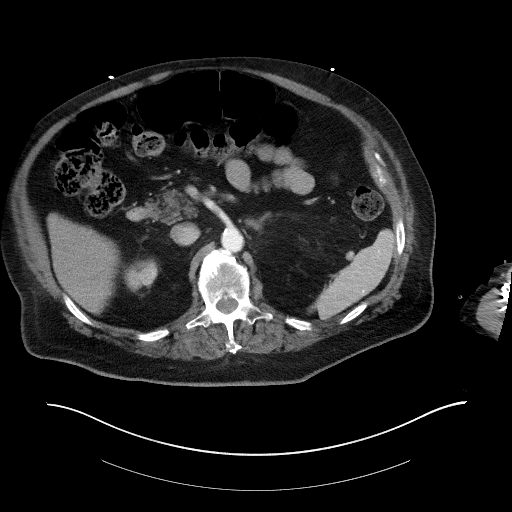
[im 82/137  mediastinal]
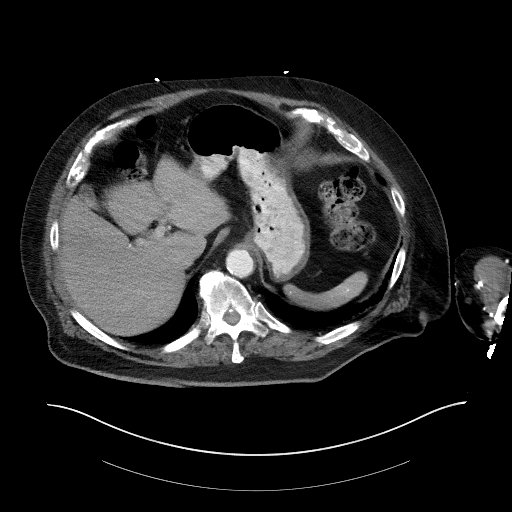
[im 96/137  mediastinal]
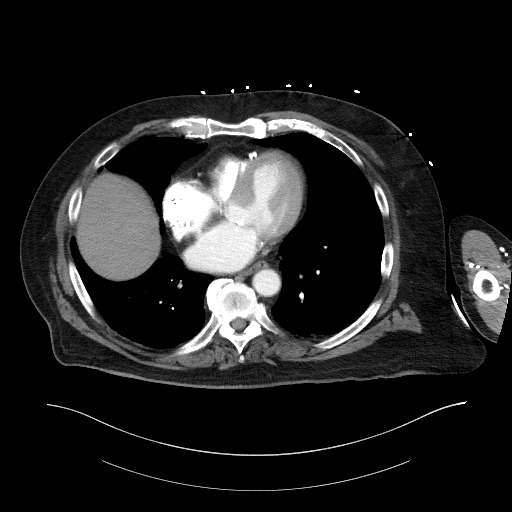
[im 109/137  mediastinal]
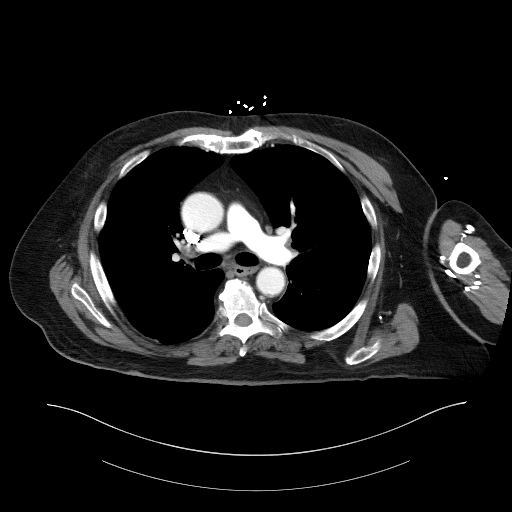
[im 123/137  mediastinal]
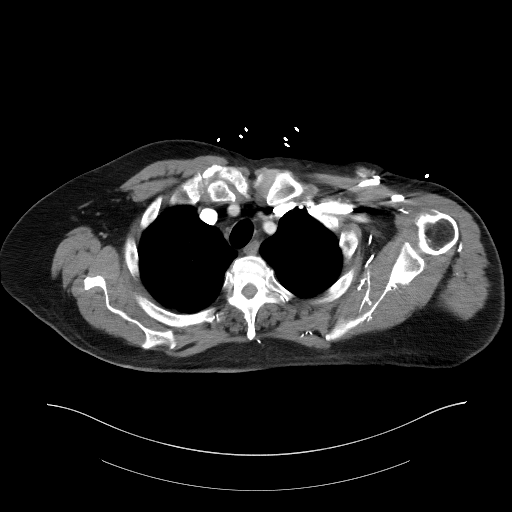
[im 123/137  bone]
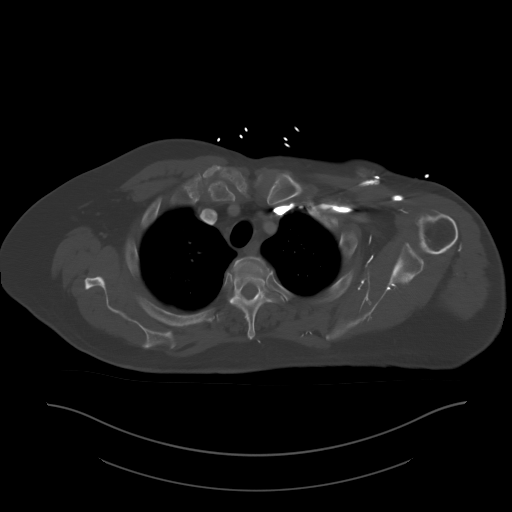

[Series 5: lungs · axial · 0.98mm/px · z∈[+1277,+1325]mm · 2 of 159 slices shown]
[im 13/159  mediastinal]
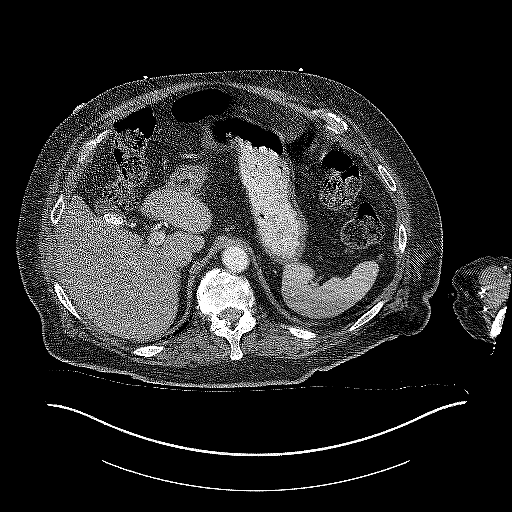
[im 37/159  mediastinal]
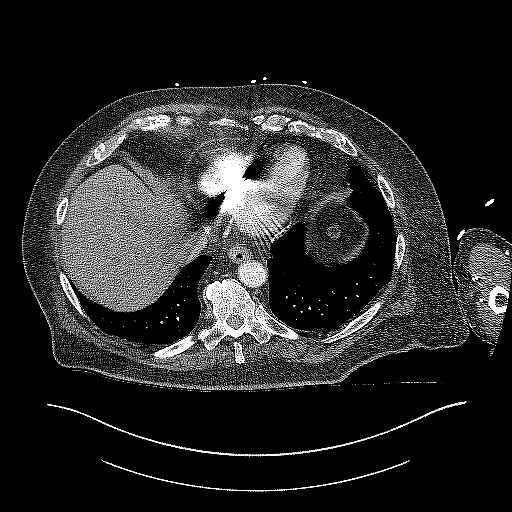

[Series 6: coronal · coronal · 0.96mm/px · 3 of 193 slices shown]
[im 39/193  mediastinal]
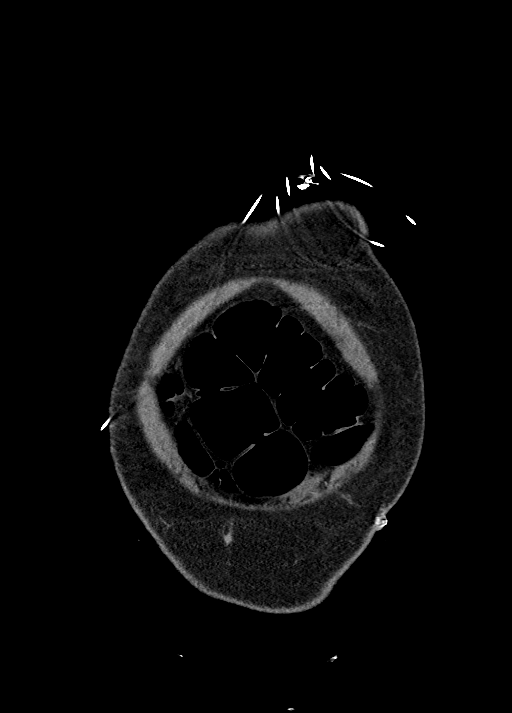
[im 77/193  mediastinal]
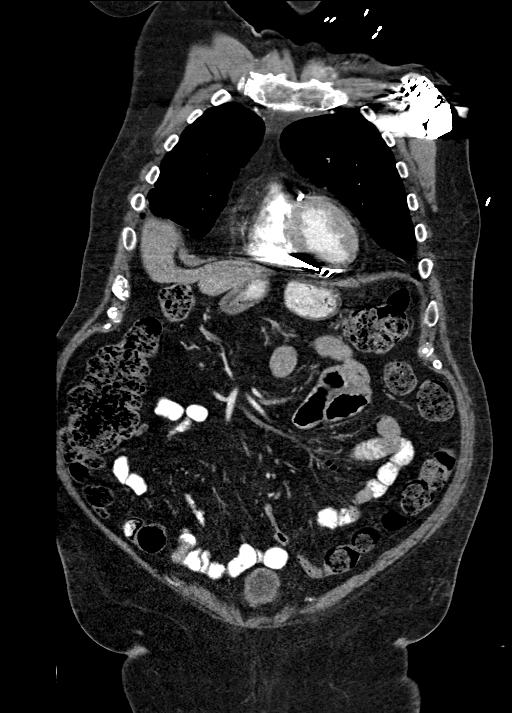
[im 116/193  mediastinal]
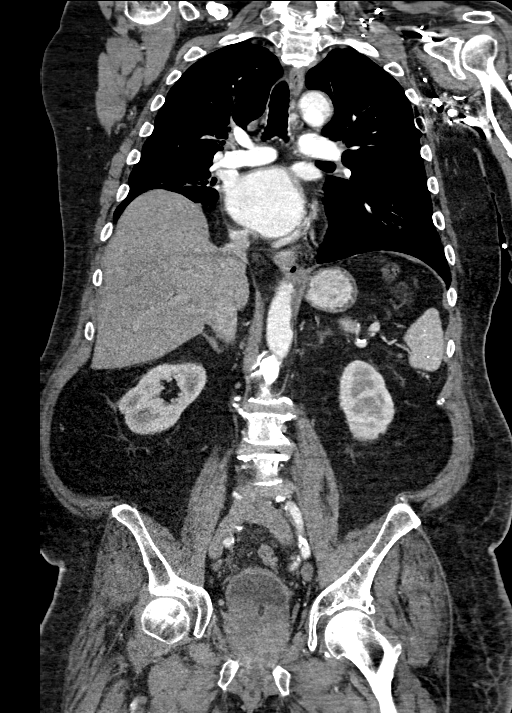

[14 of 36 positions shown; findings below may reference images not displayed]

RADIATION DOSE REDUCTION: This exam was performed according to the
departmental dose-optimization program which includes automated
exposure control, adjustment of the mA and/or kV according to
patient size and/or use of iterative reconstruction technique.

CONTRAST:  100mL OMNIPAQUE IOHEXOL 300 MG/ML  SOLN
FINDINGS: CT CHEST FINDINGS

Cardiovascular: Coronary artery calcifications and possible coronary
artery stent are noted. There is homogeneous enhancement in thoracic
aorta. Ascending thoracic aorta measures 4 cm. There are no
intraluminal filling defects in the main pulmonary artery branches
in the mediastinum.

Mediastinum/Nodes: There are subcentimeter nodes in the mediastinum
and hilar regions. There are calcified nodes in the subcarinal
region of mediastinum and right hilum.

Lungs/Pleura: There is no focal pulmonary consolidation. There are
no discrete lung nodules. There are small linear densities in both
lower lung fields. This may suggest scarring or subsegmental
atelectasis. There is no pleural effusion or pneumothorax.

Musculoskeletal: Unremarkable.

CT ABDOMEN PELVIS FINDINGS

Hepatobiliary: No focal abnormality is seen in the liver. There are
calcified gallbladder stones.

Pancreas: No focal abnormality is seen.

Spleen: There are calcified granulomas in the spleen.

Adrenals/Urinary Tract: Adrenals are unremarkable. There is no
hydronephrosis. There are few left renal stones largest measuring 9
mm. There is no significant dilation of the ureters. No definite
calcific densities seen in the courses of the ureters. There is mild
diffuse wall thickening in the urinary bladder.

Stomach/Bowel: Stomach is unremarkable. Small bowel loops are not
dilated. Appendix is not dilated. There is no focal pericecal
inflammation. There is no significant wall thickening in colon.
There is no pericolic stranding or fluid collection.

Vascular/Lymphatic: Scattered arterial calcifications are seen. No
significant lymphadenopathy seen

Reproductive: There is marked enlargement of prostate. There is
low-density tubular structure in the course of the urethra. There is
6 mm calcific density in the dependent portion of urinary bladder
close to the urethrovesical junction.

Other: There is no ascites or pneumoperitoneum.

Musculoskeletal: No focal lytic lesions are seen. In image 96 of
series 3, there are small sclerotic densities in the right iliac
bone and right pedicle of L5 vertebra. Small sclerotic densities
seen in the neck of left femur. There is small sclerotic density in
body of C6 vertebra. Degenerative changes are noted with bony spurs,
encroachment of neural foramina and spinal stenosis in the lumbar
spine.
IMPRESSION: There is no evidence of focal pulmonary consolidation or discrete
lung nodules. No significant lymphadenopathy is seen in the chest,
abdomen and pelvis.

No focal abnormality is seen in the liver. There is no significant
enlargement of adrenals. There are few sclerotic densities in the
bony structures as described in the body of the report suggesting
bone islands or sclerotic metastatic disease. If clinically
warranted, radionuclide bone scan may be considered.

There is marked enlargement of prostate suggesting prostatic
hypertrophy or neoplasm. There is wall thickening in the urinary
bladder which may be due to chronic outlet obstruction or cystitis.
Calculi are seen in the left kidney and urinary bladder.

Gallbladder stones.  Coronary artery disease.

Other findings as described in the body of the report.

## 2021-08-04 MED ORDER — IOHEXOL 9 MG/ML PO SOLN
ORAL | Status: AC
  Administered 2021-08-04: 500 mL
  Filled 2021-08-04: qty 1000

## 2021-08-04 MED ORDER — SODIUM CHLORIDE 1 G PO TABS
1.0000 g | ORAL_TABLET | Freq: Two times a day (BID) | ORAL | Status: DC
  Administered 2021-08-04 – 2021-08-10 (×12): 1 g via ORAL
  Filled 2021-08-04 (×12): qty 1

## 2021-08-04 MED ORDER — IOHEXOL 300 MG/ML  SOLN
100.0000 mL | Freq: Once | INTRAMUSCULAR | Status: AC | PRN
  Administered 2021-08-04: 100 mL via INTRAVENOUS

## 2021-08-04 MED ORDER — HEPARIN SODIUM (PORCINE) 5000 UNIT/ML IJ SOLN
5000.0000 [IU] | Freq: Three times a day (TID) | INTRAMUSCULAR | Status: DC
  Administered 2021-08-04 – 2021-08-06 (×6): 5000 [IU] via SUBCUTANEOUS
  Filled 2021-08-04 (×6): qty 1

## 2021-08-04 MED ORDER — LORAZEPAM 1 MG PO TABS
1.0000 mg | ORAL_TABLET | Freq: Once | ORAL | Status: AC
  Administered 2021-08-04: 1 mg via ORAL
  Filled 2021-08-04: qty 1

## 2021-08-04 NOTE — Progress Notes (Signed)
Patient ID: Ronald Sheppard, male   DOB: 06/29/1941, 81 y.o.   MRN: [id number]   Notes from yesterday and MRI reviewed.  Concern for cauda equina syndrome.  Neurology evaluation pending.  Recommend leaving indwelling catheter in place for now.  Following neurology evaluation and assessment for any clinical improvement, will then consider whether a voiding trial vs urodynamic study would be the most appropriate next step.   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  Will see later this week with more definitive urologic recommendations.

## 2021-08-04 NOTE — Progress Notes (Signed)
Family Medicine Teaching Service Daily Progress Note Intern Pager: [phone]  Patient name: Ronald Sheppard Medical record number: [id number] Date of birth: 03-Jun-1941 Age: 81 y.o. Gender: male  Primary Care Provider: Raina Mina., MD Consultants: Neurology, Urology Code Status: Full  Pt Overview and Major Events to Date:  07/30/21 - Patient admitted 08/01/21 - Foley exchanged  Assessment and Plan:  Ronald Sheppard is a 81 y.o. male presenting with fall. 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 Patient imaging abnormal with concerning signal enhancement in the spinal cord from T3-cauda equina. Differential broad and patient will likely need LP. Spoke with Neurology and they are requesting more imaging to rule out malignant concerns. Patient to continue working with PT/OT. Will obtain CT chest/abd/pelv W/WO contrast & MRI Brain W contrast for malignancy r/o  MRI Brain: No acute intracranial abnormality is identified. Small chronic cortical/subcortical infarct within the left occipital lobe (PCA vascular territory), mild chronic small-vessel ischemic changes within the cerebral white matter, numerous chronic infarcts within the bilateral cerebellar hemispheres, occlusion/stenosis in V4 vertebral artery.  MRI Thorac Spine: Scattered areas of abnormal signal from T3 to the conus tip, particularly advanced at T11 and T12 where there is cord swelling.  -Differential diagnosis considering demyelinating disease vs. ischemic myelopathy.  MRI Lumbar Spine: Abnormal signal within the conus with extensive abnormal enhancement in the conus, lower thoracic cord, and the cauda equina nerve roots diffusely.  -Differential diagnosis considering malignancy (including CSF disseminated metastases), inflammation (including neurosarcoidosis),  infection (including atypical etiologies), and demyelination (including Guillain-Barr Syndrome).  -Consider LP -Neurology following, appreciate recs -PT/OT eval and reat -Hold Eliquis for possible procedure, start heparin   Urinary retention   lesion on glans Continue foley, imaging suggesting issues with spinal cord in thoracic-lumbar region. UOP 800 cc yesterday. Will continue foley. -Continue flomax 0.4 mg daily -Continue foley catheter  Hyponatremia Na today 127 stable from yesterday 126, continue to monitor. Hyponatremia 2/2 SIADH.  -Fluid restriction 1500 cc daily -Start Salt tablets -AM BMP  HTN BP range from 120-130's/66-100, long term target BP range 130-150 per neurology for stenosis in posterior circulation.  -Cardizem 120 mg daily -continue to monitor  All other conditions chronic and stable HLD Hx of Cerebellar stroke Hx of MI   CAD with stents MI in 2020 w/ stent in LAD an CX and defibrillator placed.  Hx of PAF on Eliquis  FEN/GI: Carb modified PPx: Heparin Dispo:SNF in 3 or more days.    Subjective:  Patient slept poorly overnight, but has no complaints of pain this morning.   Objective: Temp:  [98 F (36.7 C)-98.5 F (36.9 C)] 98.5 F (36.9 C) (01/31 0302) Pulse Rate:  [76-177] 84 (01/31 0302) Resp:  [17-23] 23 (01/31 0302) BP: (97-136)/(66-100) 136/79 (01/31 0302) SpO2:  [91 %-100 %] 91 % (01/31 0302) Physical Exam: General: Well appearing, conversant, NAD, white male Cardiovascular: RRR, [place] Respiratory: CTABL Abdomen: Soft, NTTP, non-distended Extremities: No edema, LLE 2/5 strength, 5/5 strength preserved in all other extremities, minimal-mild dysarthria  Laboratory: Recent Labs  Lab 07/30/21 1236 07/31/21 0056 08/01/21 0616  WBC 14.2* 15.7* 12.5*  HGB 13.6 13.8 14.9  HCT 41.4 40.7 44.1  PLT 346 338 283   Recent Labs  Lab 08/02/21 0230 08/03/21 0028 08/04/21 0227  NA 126* 126* 127*  K 3.8 4.0 3.8  CL  91* 92* 89*  CO2 25  25 25   BUN 14 14 13   CREATININE 0.72 0.73 0.71  CALCIUM 8.6* 8.6* 8.9  GLUCOSE 130* 129* 138*      Imaging/Diagnostic Tests:  MR BRAIN WO CONTRAST  Result Date: 08/03/2021 CLINICAL DATA:  Provided history: Dizziness, persistent/recurrent, cardiac or vascular cause suspected. Stroke-like symptoms. Additional history provided: Left leg weakness for 1 week, slurred speech last Monday, history of stroke. EXAM: MRI HEAD WITHOUT CONTRAST TECHNIQUE: Multiplanar, multiecho pulse sequences of the brain and surrounding structures were obtained without intravenous contrast. COMPARISON:  CT angiogram head/neck 07/30/2021. FINDINGS: Brain: The examination is intermittently motion degraded, limiting evaluation. Most notably, there is moderate motion degradation of the axial T2 TSE sequence and moderate/severe motion a shin of the axial T1 weighted sequence. Mild-to-moderate generalized cerebral and cerebellar atrophy. Small chronic cortical/subcortical infarct within the left occipital lobe. Mild scattered and ill-defined T2 FLAIR hyperintense signal abnormality elsewhere within the cerebral white matter, nonspecific but compatible chronic small vessel ischemic disease. Numerous chronic infarcts within the bilateral cerebellar hemispheres. There is no acute infarct. No evidence of an intracranial mass. No chronic intracranial blood products. No extra-axial fluid collection. No midline shift. Vascular: Signal abnormality within the V4 left vertebral artery corresponding with the occlusion versus severe stenosis demonstrated at this site on the CTA head/neck of 07/30/2021. Skull and upper cervical spine: No focal suspicious marrow lesion. Sinuses/Orbits: Visualized orbits show no acute finding. Trace mucosal thickening versus fluid within the bilateral ethmoid air cells. Small mucous retention cyst within the inferior left maxillary sinus. Other: Small right mastoid effusion. Trace fluid also present within the left  mastoid air cells. IMPRESSION: 1. Motion degraded examination, as described. 2. The diffusion-weighted imaging is of good quality. No evidence of acute infarction. 3. No acute intracranial abnormality is identified. 4. Small chronic cortical/subcortical infarct within the left occipital lobe (PCA vascular territory). 5. Background mild chronic small-vessel ischemic changes within the cerebral white matter. 6. Numerous chronic infarcts within the bilateral cerebellar hemispheres. 7. Mild-to-moderate generalized cerebral and cerebellar atrophy. 8. Signal abnormality within the V4 left vertebral artery corresponding with the occlusion versus severe stenosis demonstrated at this site on the CTA head/neck of 07/30/2021. 9. Small right mastoid effusion. Electronically Signed   By: Kellie Simmering D.O.   On: 08/03/2021 16:23   MR THORACIC SPINE WO CONTRAST  Result Date: 08/03/2021 CLINICAL DATA:  Midthoracic region back pain. Left leg weakness over the last week. EXAM: MRI THORACIC SPINE WITHOUT CONTRAST TECHNIQUE: Multiplanar, multisequence MR imaging of the thoracic spine was performed. No intravenous contrast was administered. COMPARISON:  CT 07/17/2021 FINDINGS: Alignment:  No thoracic malalignment. Vertebrae: No fracture or focal bone lesion. As better shown by CT, there are solid bridging osteophytes anteriorly throughout the thoracic region. Cord: Patchy abnormal T2 signal is seen scattered within the thoracic cord beginning at the T3 level extending all the way to the conus tip. This is particularly pronounced in the distal thoracic cord at the T11 and T12 levels where the cord shows some swelling. Paraspinal and other soft tissues: Negative except for cholelithiasis. Disc levels: No disc level pathology. No stenosis of the canal or foramina. Minimal non-compressive disc bulge at T3-4. Mild facet and ligamentous hypertrophy in the lower thoracic region but without compressive effect upon the canal. IMPRESSION:  Scattered areas of abnormal T2 signal within the thoracic spinal cord from T3 to the conus tip. This is particularly advanced at T11 and T12 where there is cord swelling.  Differential diagnosis is that of demyelinating disease versus ischemic myelopathy. I spoke with the technologist who is in the process of performing the lumbar scan. We are going to get contrast administered so we can see the distal cord. Electronically Signed   By: Nelson Chimes M.D.   On: 08/03/2021 16:31   MR Lumbar Spine W Wo Contrast  Result Date: 08/03/2021 CLINICAL DATA:  Spinal stenosis, lumbar EXAM: MRI LUMBAR SPINE WITHOUT AND WITH CONTRAST TECHNIQUE: Multiplanar and multiecho pulse sequences of the lumbar spine were obtained without and with intravenous contrast. CONTRAST:  60mL GADAVIST GADOBUTROL 1 MMOL/ML IV SOLN COMPARISON:  CT lumbar spine 07/30/2021. FINDINGS: Segmentation:  Standard. Alignment:  Normal. Vertebrae: Mild degenerative/discogenic endplate signal changes at L3-L4 and L4-L5 posteriorly with Schmorl's nodes. Otherwise, no focal marrow edema chest acute fracture discitis/osteomyelitis. Mildly heterogeneous bone marrow without suspicious bone lesion. Conus medullaris and cauda equina: Conus extends to the L1 level. Abnormal T2 hyperintense signal within the conus. Patchy enhancement in the conus and areas of the lower thoracic cord, which appears to be predominantly pial. There is also abnormal diffuse enhancement of the cauda quinine nerve roots. Paraspinal and other soft tissues: Unremarkable. Disc levels: T12-L1: No significant disc protrusion, foraminal stenosis, or canal stenosis. L1-L2: Mild disc bulging and facet arthropathy without significant stenosis. L2-L3: Disc bulging, eccentric to the right. Right greater than left facet arthropathy. Resulting moderate right and mild left foraminal stenosis. Mild canal stenosis with right greater than left subarticular recess narrowing. L3-L4: Disc bulging and endplate  spurring. Bilateral facet arthropathy. Ligamentum flavum thickening. Resulting moderate bilateral foraminal stenosis and moderate canal stenosis with bilateral subarticular recess narrowing. L4-L5: Right eccentric disc bulge. Moderate bilateral facet arthropathy. Ligamentum flavum thickening. Resulting moderate right greater than left foraminal stenosis and mild canal stenosis with right greater than left subarticular recess narrowing. L5-S1: Right eccentric disc bulge with bilateral facet arthropathy. Resulting severe right and moderate left foraminal stenosis. No significant canal stenosis. Narrowing of the right subarticular recess. IMPRESSION: 1. Abnormal signal within the conus with extensive abnormal enhancement in the conus, visualized lower thoracic cord, and the cauda equina nerve roots diffusely. Differential considerations include malignancy (including CSF disseminated metastases), inflammation (including neurosarcoidosis, although there were no findings to suggest sarcoidosis on recent CT chest), infection (including atypical etiologies), and demyelination (including Guillain-Barr Syndrome). Multiple sclerosis is thought less likely given patient age/sex and extensive leptomeningeal/cauda equina enhancement. Recommend correlation with lumbar puncture. Also, postcontrast imaging of the brain may be worthwhile to evaluate for abnormal intracranial enhancement. 2. At L5-S1, severe right and moderate left foraminal stenosis. 3. At L3-L4, moderate canal and bilateral foraminal stenosis. 4. At L4-L5 moderate right greater than left foraminal stenosis and mild canal stenosis. 5. At L2-L3, moderate right and mild left foraminal stenosis. Electronically Signed   By: Margaretha Sheffield M.D.   On: 08/03/2021 18:00     [name], MD 08/04/2021, 6:16 AM PGY-1, [place] Intern pager: [phone], text pages welcome

## 2021-08-04 NOTE — Progress Notes (Addendum)
STROKE TEAM PROGRESS NOTE   INTERVAL HISTORY Patient seen and assessed at bedside. Patient proximal LLE weakness improving but continues to be very weak more distally. Remainder of neuro exam largely unchanged.  Lumbar MRI concerning for abnormal signal enhancement in the conus and cauda equina as well as L5-s1, L3-L4, L4-5, L2-L3 foraminal stenosis. Thoracic MRI also concerning for scattered abnormal T2 signal from T3 to conus tip.  MRI brain shows no evidence of acute intracranial abnormalities suggesting leg weakness likely related to spinal findings and not due to an acute infarct in the brain. CT Lung, Abdomen and Pelvis: There is no evidence of focal pulmonary consolidation or discrete lung nodules. No significant lymphadenopathy is seen in the chest, abdomen and pelvis. No abnormalities noted on liver or adrenal enlargement. Large prostate concerning for BPH or neoplasm.   Vitals:   08/03/21 2306 08/04/21 0302 08/04/21 0732 08/04/21 1147  BP: (!) 131/100 136/79 115/74 128/82  Pulse: 84 84 88 83  Resp: 20 (!) 23 (!) 21 19  Temp: 98.1 F (36.7 C) 98.5 F (36.9 C) 97.8 F (36.6 C) 98.3 F (36.8 C)  TempSrc: Oral Oral Oral Axillary  SpO2: 100% 91% 100% 100%   CBC:  Recent Labs  Lab 07/31/21 0056 08/01/21 0616  WBC 15.7* 12.5*  HGB 13.8 14.9  HCT 40.7 44.1  MCV 85.7 84.5  PLT 338 680    Basic Metabolic Panel:  Recent Labs  Lab 08/03/21 0028 08/04/21 0227  NA 126* 127*  K 4.0 3.8  CL 92* 89*  CO2 25 25  GLUCOSE 129* 138*  BUN 14 13  CREATININE 0.73 0.71  CALCIUM 8.6* 8.9    Lipid Panel:  Recent Labs  Lab 07/31/21 1247  CHOL 118  TRIG 45  HDL 60  CHOLHDL 2.0  VLDL 9  LDLCALC 49    HgbA1c:  Recent Labs  Lab 07/31/21 0056  HGBA1C 6.2*    Urine Drug Screen:  Recent Labs  Lab 07/31/21 1020  LABOPIA POSITIVE*  COCAINSCRNUR NONE DETECTED  LABBENZ NONE DETECTED  AMPHETMU NONE DETECTED  THCU NONE DETECTED  LABBARB NONE DETECTED     Alcohol Level  No results for input(s): ETH in the last 168 hours.  IMAGING past 24 hours MR BRAIN WO CONTRAST  Result Date: 08/03/2021 CLINICAL DATA:  Provided history: Dizziness, persistent/recurrent, cardiac or vascular cause suspected. Stroke-like symptoms. Additional history provided: Left leg weakness for 1 week, slurred speech last Monday, history of stroke. EXAM: MRI HEAD WITHOUT CONTRAST TECHNIQUE: Multiplanar, multiecho pulse sequences of the brain and surrounding structures were obtained without intravenous contrast. COMPARISON:  CT angiogram head/neck 07/30/2021. FINDINGS: Brain: The examination is intermittently motion degraded, limiting evaluation. Most notably, there is moderate motion degradation of the axial T2 TSE sequence and moderate/severe motion a shin of the axial T1 weighted sequence. Mild-to-moderate generalized cerebral and cerebellar atrophy. Small chronic cortical/subcortical infarct within the left occipital lobe. Mild scattered and ill-defined T2 FLAIR hyperintense signal abnormality elsewhere within the cerebral white matter, nonspecific but compatible chronic small vessel ischemic disease. Numerous chronic infarcts within the bilateral cerebellar hemispheres. There is no acute infarct. No evidence of an intracranial mass. No chronic intracranial blood products. No extra-axial fluid collection. No midline shift. Vascular: Signal abnormality within the V4 left vertebral artery corresponding with the occlusion versus severe stenosis demonstrated at this site on the CTA head/neck of 07/30/2021. Skull and upper cervical spine: No focal suspicious marrow lesion. Sinuses/Orbits: Visualized orbits show no acute finding. Trace mucosal thickening  versus fluid within the bilateral ethmoid air cells. Small mucous retention cyst within the inferior left maxillary sinus. Other: Small right mastoid effusion. Trace fluid also present within the left mastoid air cells. IMPRESSION: 1. Motion degraded  examination, as described. 2. The diffusion-weighted imaging is of good quality. No evidence of acute infarction. 3. No acute intracranial abnormality is identified. 4. Small chronic cortical/subcortical infarct within the left occipital lobe (PCA vascular territory). 5. Background mild chronic small-vessel ischemic changes within the cerebral white matter. 6. Numerous chronic infarcts within the bilateral cerebellar hemispheres. 7. Mild-to-moderate generalized cerebral and cerebellar atrophy. 8. Signal abnormality within the V4 left vertebral artery corresponding with the occlusion versus severe stenosis demonstrated at this site on the CTA head/neck of 07/30/2021. 9. Small right mastoid effusion. Electronically Signed   By: Kellie Simmering D.O.   On: 08/03/2021 16:23   MR THORACIC SPINE WO CONTRAST  Result Date: 08/03/2021 CLINICAL DATA:  Midthoracic region back pain. Left leg weakness over the last week. EXAM: MRI THORACIC SPINE WITHOUT CONTRAST TECHNIQUE: Multiplanar, multisequence MR imaging of the thoracic spine was performed. No intravenous contrast was administered. COMPARISON:  CT 07/17/2021 FINDINGS: Alignment:  No thoracic malalignment. Vertebrae: No fracture or focal bone lesion. As better shown by CT, there are solid bridging osteophytes anteriorly throughout the thoracic region. Cord: Patchy abnormal T2 signal is seen scattered within the thoracic cord beginning at the T3 level extending all the way to the conus tip. This is particularly pronounced in the distal thoracic cord at the T11 and T12 levels where the cord shows some swelling. Paraspinal and other soft tissues: Negative except for cholelithiasis. Disc levels: No disc level pathology. No stenosis of the canal or foramina. Minimal non-compressive disc bulge at T3-4. Mild facet and ligamentous hypertrophy in the lower thoracic region but without compressive effect upon the canal. IMPRESSION: Scattered areas of abnormal T2 signal within the  thoracic spinal cord from T3 to the conus tip. This is particularly advanced at T11 and T12 where there is cord swelling. Differential diagnosis is that of demyelinating disease versus ischemic myelopathy. I spoke with the technologist who is in the process of performing the lumbar scan. We are going to get contrast administered so we can see the distal cord. Electronically Signed   By: Nelson Chimes M.D.   On: 08/03/2021 16:31   MR Lumbar Spine W Wo Contrast  Result Date: 08/03/2021 CLINICAL DATA:  Spinal stenosis, lumbar EXAM: MRI LUMBAR SPINE WITHOUT AND WITH CONTRAST TECHNIQUE: Multiplanar and multiecho pulse sequences of the lumbar spine were obtained without and with intravenous contrast. CONTRAST:  53mL GADAVIST GADOBUTROL 1 MMOL/ML IV SOLN COMPARISON:  CT lumbar spine 07/30/2021. FINDINGS: Segmentation:  Standard. Alignment:  Normal. Vertebrae: Mild degenerative/discogenic endplate signal changes at L3-L4 and L4-L5 posteriorly with Schmorl's nodes. Otherwise, no focal marrow edema chest acute fracture discitis/osteomyelitis. Mildly heterogeneous bone marrow without suspicious bone lesion. Conus medullaris and cauda equina: Conus extends to the L1 level. Abnormal T2 hyperintense signal within the conus. Patchy enhancement in the conus and areas of the lower thoracic cord, which appears to be predominantly pial. There is also abnormal diffuse enhancement of the cauda quinine nerve roots. Paraspinal and other soft tissues: Unremarkable. Disc levels: T12-L1: No significant disc protrusion, foraminal stenosis, or canal stenosis. L1-L2: Mild disc bulging and facet arthropathy without significant stenosis. L2-L3: Disc bulging, eccentric to the right. Right greater than left facet arthropathy. Resulting moderate right and mild left foraminal stenosis. Mild canal stenosis with right  greater than left subarticular recess narrowing. L3-L4: Disc bulging and endplate spurring. Bilateral facet arthropathy. Ligamentum  flavum thickening. Resulting moderate bilateral foraminal stenosis and moderate canal stenosis with bilateral subarticular recess narrowing. L4-L5: Right eccentric disc bulge. Moderate bilateral facet arthropathy. Ligamentum flavum thickening. Resulting moderate right greater than left foraminal stenosis and mild canal stenosis with right greater than left subarticular recess narrowing. L5-S1: Right eccentric disc bulge with bilateral facet arthropathy. Resulting severe right and moderate left foraminal stenosis. No significant canal stenosis. Narrowing of the right subarticular recess. IMPRESSION: 1. Abnormal signal within the conus with extensive abnormal enhancement in the conus, visualized lower thoracic cord, and the cauda equina nerve roots diffusely. Differential considerations include malignancy (including CSF disseminated metastases), inflammation (including neurosarcoidosis, although there were no findings to suggest sarcoidosis on recent CT chest), infection (including atypical etiologies), and demyelination (including Guillain-Barr Syndrome). Multiple sclerosis is thought less likely given patient age/sex and extensive leptomeningeal/cauda equina enhancement. Recommend correlation with lumbar puncture. Also, postcontrast imaging of the brain may be worthwhile to evaluate for abnormal intracranial enhancement. 2. At L5-S1, severe right and moderate left foraminal stenosis. 3. At L3-L4, moderate canal and bilateral foraminal stenosis. 4. At L4-L5 moderate right greater than left foraminal stenosis and mild canal stenosis. 5. At L2-L3, moderate right and mild left foraminal stenosis. Electronically Signed   By: Margaretha Sheffield M.D.   On: 08/03/2021 18:00    PHYSICAL EXAM  Temp:  [97.8 F (36.6 C)-98.5 F (36.9 C)] 98.3 F (36.8 C) (01/31 1147) Pulse Rate:  [76-96] 83 (01/31 1147) Resp:  [19-23] 19 (01/31 1147) BP: (115-136)/(66-100) 128/82 (01/31 1147) SpO2:  [91 %-100 %] 100 % (01/31  1147)  General - Well nourished, well developed pleasant elderly male, in no apparent distress.  Cardiovascular - Tachycardic (afib).  Mental Status -  Level of arousal and orientation to time, place, and person were intact. No aphasia. Speech is more clear today.  Cranial Nerves II - XII - II - Visual field intact OU. III, IV, VI - Extraocular movements intact. VII - Facial movement intact bilaterally. VIII - Hearing & vestibular intact bilaterally.  XII - Tongue protrusion intact.  Motor Strength - LLE significantly weaker than right 2/5 proximally and 0/5 distally.  LUE/RLE/RUE 5/5  Motor Tone - Muscle tone was assessed at the neck and appendages and was normal. Reflexes - The patients reflexes were symmetrical in all extremities and he had no pathological reflexes. Sensory - Reports difference in sensation to light touch in LLE from hip down Coordination - The patient had normal movements in the hands and feet with no ataxia or dysmetria.  Tremor was absent. Gait and Station - deferred.  ASSESSMENT/PLAN [name] is a 81 y.o. male with history of MI w/ ICD, CAD s/p stent, cerebellar stroke, HTN, HLD, PAF noncompliant with eliquis due to cost, BPH presenting with several days of left sided weakness. Lumbar and Thoracic spine MRI concerning for malignancy vs inflammation vs infection.   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 No occlusion or hemodynamically significant stenosis in the neck.   2D Echo EF 45-50% LDL 49 HgbA1c 6.2 VTE prophylaxis - Eliquis No antithrombotic prior to admission, now on Eliquis 5mg  bid. Therapy recommendations: SNF Disposition:  pending Lumbar MRI concerning for  abnormal signal  enhancement in the conus and cauda equina as well as L5-s1, L3-L4, L4-5, L2-L3 foraminal stenosis. Thoracic MRI also concerning for scattered abnormal T2 signal from T3 to conus tip.  MRI brain shows no evidence of acute intracranial abnormalities suggesting leg weakness likely related to spinal findings and not due to an acute infarct in the brain. CT Lung, Abdomen and Pelvis: There is no evidence of focal pulmonary consolidation or discrete lung nodules. No significant lymphadenopathy is seen in the chest, abdomen and pelvis. No abnormalities noted on liver or adrenal enlargement. Large prostate concerning for BPH or neoplasm.   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 STEMI w/ LAD occlusion- 3 drug eluting stents placed in LAD and a stent placed in the circumflex  Was on eliquis, brilinta, and ASA at the time he was discharged and took the eliquis and brilinta for 30 days AICD conditional with MRI [facility] day # 3  France Ravens, MD PGY1 Resident  I have personally obtained history,examined this patient, reviewed notes, independently viewed imaging studies, participated in medical decision making and plan of care.ROS completed by me personally and pertinent positives fully documented  I have made any additions or clarifications directly to the above note. Agree with note above.  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  Recommend check CT and chest abdomen pelvis and MRI scan of the brain with contrast and if unyielding may need spinal tap high-volume low functioning status.  We will transfer this patient to the neuro hospitalist service to follow-up starting tomorrow.  Discussed with Dr. Theda Sers and Dr. Thompson Grayer.  Greater than 50% time during this 35-minute visit was spent in counseling coordination of care and discussion patient and care team and answering questions.  Stroke team will sign off.  Neuro hospitalist team to follow  Antony Contras, MD Medical Director [facility] Pager: [phone] 08/04/2021 3:55 PM   To contact Stroke Continuity provider, please refer to [email or web address] After hours, contact General Neurology

## 2021-08-04 NOTE — Plan of Care (Signed)
°  Problem: Education: Goal: Knowledge of General Education information will improve Description: Including pain rating scale, medication(s)/side effects and non-pharmacologic comfort measures Outcome: Progressing   Problem: Clinical Measurements: Goal: Ability to maintain clinical measurements within normal limits will improve Outcome: Progressing Goal: Will remain free from infection Outcome: Progressing Goal: Cardiovascular complication will be avoided Outcome: Progressing   Problem: Coping: Goal: Level of anxiety will decrease Outcome: Progressing   Problem: Pain Managment: Goal: General experience of comfort will improve Outcome: Progressing   Problem: Safety: Goal: Ability to remain free from injury will improve Outcome: Progressing

## 2021-08-04 NOTE — Care Management Important Message (Signed)
Important Message  Patient Details  Name: Ronald Sheppard MRN: [id number] Date of Birth: [birth year]   Medicare Important Message Given:  Yes     Orbie Pyo 08/04/2021, 2:28 PM

## 2021-08-04 NOTE — Progress Notes (Signed)
Physical Therapy Treatment Patient Details Name: Ronald Sheppard MRN: [id number] DOB: [birth year] Today's Date: 08/04/2021   History of Present Illness Pt is 81 yo male admitted on 07/30/21.  Notes and report vary on reason for admission.  Per H and P pt with falls at home after recent hospitalization for urinary retention.  However, per family and neurology - pt admitted to [facility]  last week for CVA and was sent to rehab where he developed further back pain, L sided weakness, and slurred speech so sent back to hospital. 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 CT lumbar spine multilevel lumbar foraminal stenosis. MRI brain-no acute abnormality; chronic infarcts bil cerebellar and Left occipital lobe; MRI lumbar-abnormal signal within the conus with extensive abnormal enhancement in the conus; MRI thoracic-Scattered areas of abnormal signal within the thoracic spinal cord from T3 to the conus tip.   Pt with hx including MI with AICD, CAD s/p stent, cerebellar CVA, HTN, HLD, PAF, BPH, and recent CVA per family    PT Comments    Patient seen for bed level exercises with bil LEs. Noted MRI results (see above) with currently a long list of differential diagnoses. Will continue to follow and progress to EOB and eventually OOB with +2 assist and as more is known re: his spinal cord/spine.     Recommendations for follow up therapy are one component of a multi-disciplinary discharge planning process, led by the attending physician.  Recommendations may be updated based on patient status, additional functional criteria and insurance authorization.  Follow Up Recommendations  Skilled nursing-short term rehab (<3 hours/day)     Assistance Recommended at Discharge Frequent or constant Supervision/Assistance  Patient can return home with the following Two people to help with walking and/or transfers;Two people to help with bathing/dressing/bathroom;Assist for  transportation;Assistance with cooking/housework   Equipment Recommendations  Other (comment) (defer to post acute)    Recommendations for Other Services       Precautions / Restrictions Precautions Precautions: Fall     Mobility  Bed Mobility               General bed mobility comments: recent MRI shows T3 through conus abnormalities; deferred sitting EOB until have +2 assist; likely will need max-total trunk support    Transfers                        Ambulation/Gait                   Stairs             Wheelchair Mobility    Modified Rankin (Stroke Patients Only) Modified Rankin (Stroke Patients Only) Pre-Morbid Rankin Score: Slight disability Modified Rankin: Severe disability     Balance       Sitting balance - Comments: deferred                                    Cognition Arousal/Alertness: Awake/alert Behavior During Therapy: WFL for tasks assessed/performed Overall Cognitive Status: No family/caregiver present to determine baseline cognitive functioning                                 General Comments: HOH and requires repetition of cues, but appears due to hearing; good awareness that his legs are currently  too weak to try standing/walking        Exercises General Exercises - Lower Extremity Ankle Circles/Pumps: AROM, Right, PROM, Left, 10 reps Short Arc Quad: AAROM, Right, PROM, Left, 10 reps Heel Slides: AAROM, Both, 10 reps (pt reqired assist for flexion on left; could overcome min resistance into extension; RLE much stronger) Hip ABduction/ADduction: AAROM, Both, 10 reps, Supine    General Comments        Pertinent Vitals/Pain Pain Assessment Pain Assessment: Faces Faces Pain Scale: Hurts even more Pain Location: back with some LE exercises Pain Descriptors / Indicators: Grimacing, Moaning, Discomfort Pain Intervention(s): Limited activity within patient's tolerance,  Monitored during session, Repositioned    Home Living                          Prior Function            PT Goals (current goals can now be found in the care plan section) Acute Rehab PT Goals Patient Stated Goal: decrease pain ; daughter in agreement with return to SNF PT Goal Formulation: With patient Time For Goal Achievement: 08/14/21 Potential to Achieve Goals: Fair Progress towards PT goals:  (Goals not set on eval; set today)    Frequency    Min 3X/week      PT Plan Current plan remains appropriate    Co-evaluation              AM-PAC PT "6 Clicks" Mobility   Outcome Measure  Help needed turning from your back to your side while in a flat bed without using bedrails?: Total Help needed moving from lying on your back to sitting on the side of a flat bed without using bedrails?: Total Help needed moving to and from a bed to a chair (including a wheelchair)?: Total Help needed standing up from a chair using your arms (e.g., wheelchair or bedside chair)?: Total Help needed to walk in hospital room?: Total Help needed climbing 3-5 steps with a railing? : Total 6 Click Score: 6    End of Session   Activity Tolerance: Patient tolerated treatment well Patient left: in bed;with call bell/phone within reach;with bed alarm set (in ED) Nurse Communication: Mobility status;Need for lift equipment PT Visit Diagnosis: Other abnormalities of gait and mobility (R26.89);Hemiplegia and hemiparesis;Muscle weakness (generalized) (M62.81)     Time: 6599-3570 PT Time Calculation (min) (ACUTE ONLY): 19 min  Charges:  $Therapeutic Exercise: 8-22 mins                      Arby Barrette, PT Acute Rehabilitation Services  Pager [phone] Office [phone]    Rexanne Mano 08/04/2021, 2:21 PM

## 2021-08-04 NOTE — Progress Notes (Signed)
FPTS Brief Progress Note  S: Nurse tech reports that the patient is doing well tonight and was not altered when she helped bathe him.  No concerns at this time.   O: BP 137/77 (BP Location: Right Arm)    Pulse 84    Temp 97.6 F (36.4 C) (Oral)    Resp (!) 24    SpO2 100%   General: Resting comfortably with lights off on evaluation Cardiac: Regular rate  A/P: Concern for cauda equina versus subacute stroke Brain MRI with contrast ordered for tomorrow per neurology's recommendations.  Appreciate neurology's assistance.  Continue current plan.  Urinary retention Patient with Foley in place.  Continuing Foley per neurology's recommendations.  Hyponatremia 2/2 SIADH Continuing to monitor daily BMPs Continuing salt tablets - Orders reviewed. Labs for AM ordered, which was adjusted as needed.   Gifford Shave, MD 08/04/2021, 10:18 PM PGY-3, [facility] Night Resident  Please page [phone] with questions.

## 2021-08-05 ENCOUNTER — Inpatient Hospital Stay (HOSPITAL_COMMUNITY): Payer: Medicare Other

## 2021-08-05 LAB — BASIC METABOLIC PANEL
Anion gap: 10 (ref 5–15)
BUN: 10 mg/dL (ref 8–23)
CO2: 27 mmol/L (ref 22–32)
Calcium: 8.9 mg/dL (ref 8.9–10.3)
Chloride: 91 mmol/L — ABNORMAL LOW (ref 98–111)
Creatinine, Ser: 0.69 mg/dL (ref 0.61–1.24)
GFR, Estimated: >60 mL/min (ref >60)
Glucose, Bld: 134 mg/dL — ABNORMAL HIGH (ref 70–99)
Potassium: 3.6 mmol/L (ref 3.5–5.1)
Sodium: 128 mmol/L — ABNORMAL LOW (ref 135–145)

## 2021-08-05 IMAGING — MR MR HEAD W/ CM
4 of 5 series · 19 of 48 positions shown · IV contrast (Yes   MULTIHANCE)
Comparison: Brain MRI [DATE]

CLINICAL DATA: Left leg weakness and numbness, concern for
malignancy

EXAM:
MRI HEAD WITH CONTRAST
TECHNIQUE: Multiplanar, multiecho pulse sequences of the brain and surrounding
structures were obtained with intravenous contrast.
CONTRAST:  10mL GADAVIST GADOBUTROL 1 MMOL/ML IV SOLN

[Series 4: T2 post-contrast · coronal · 5.0mm · 0.39mm/px · 3 of 26 slices shown]
[im 6/26]
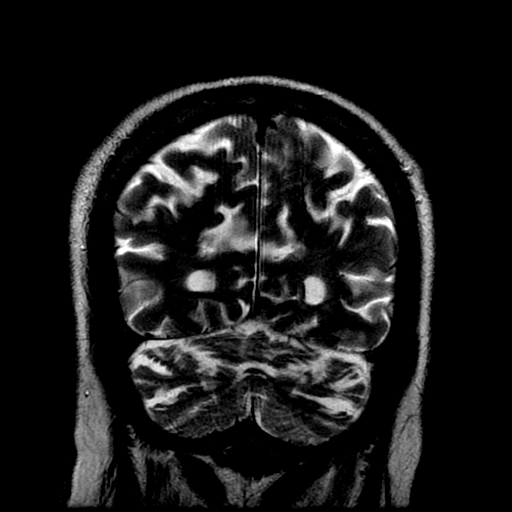
[im 16/26]
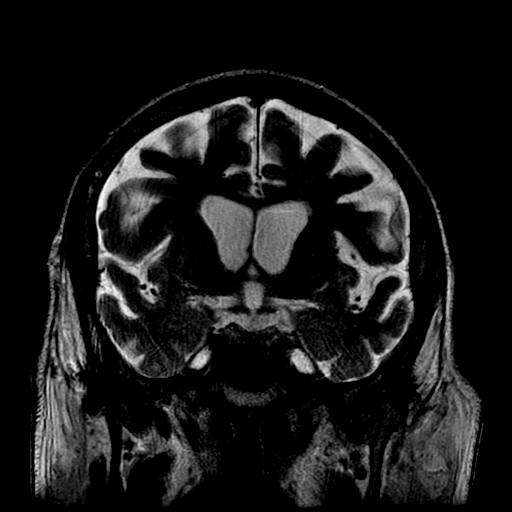
[im 26/26]
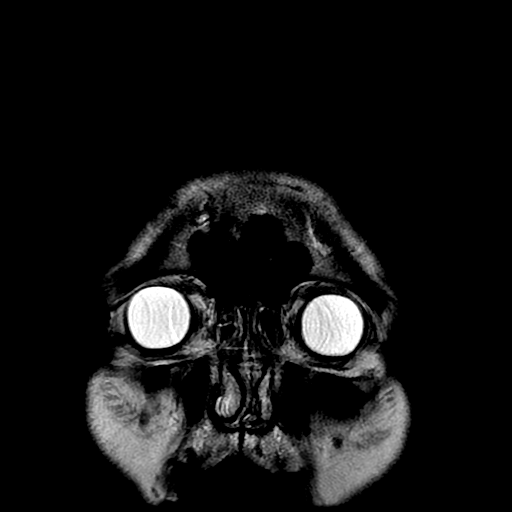

[Series 5: T1 post-contrast · axial · 3.0mm · 0.47mm/px · z∈[-67,+63]mm · 10 of 50 slices shown (1 of 3)]
[im 1/50]
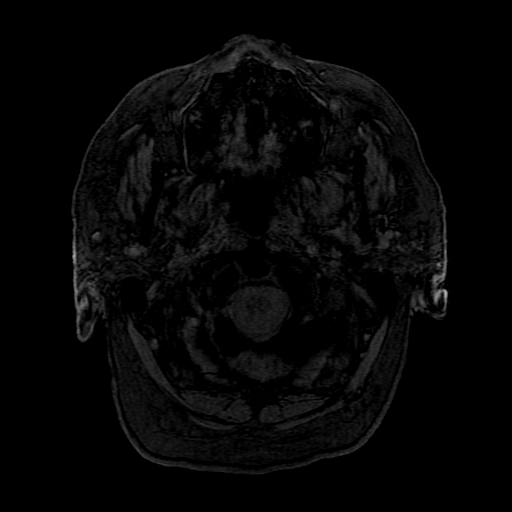
[im 5/50]
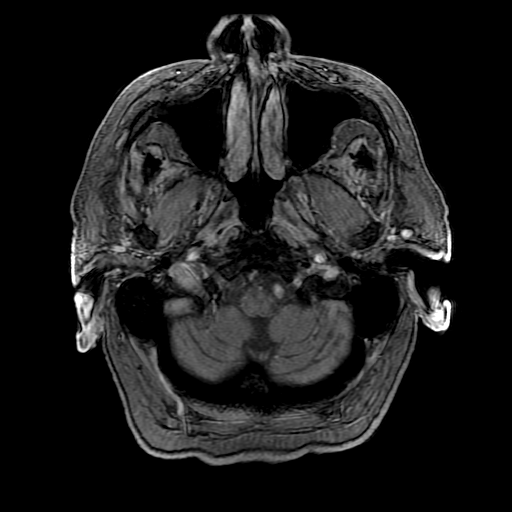
[im 10/50]
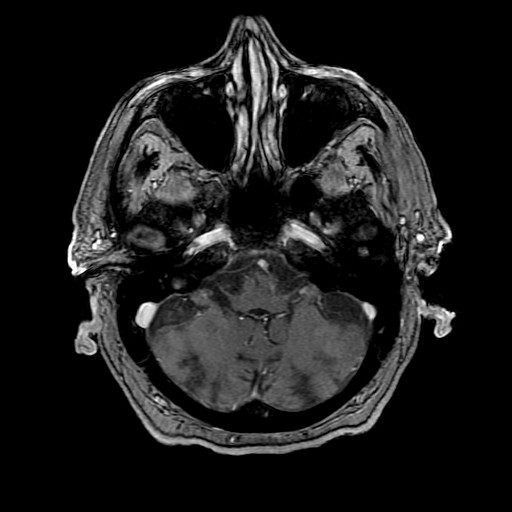
[im 15/50]
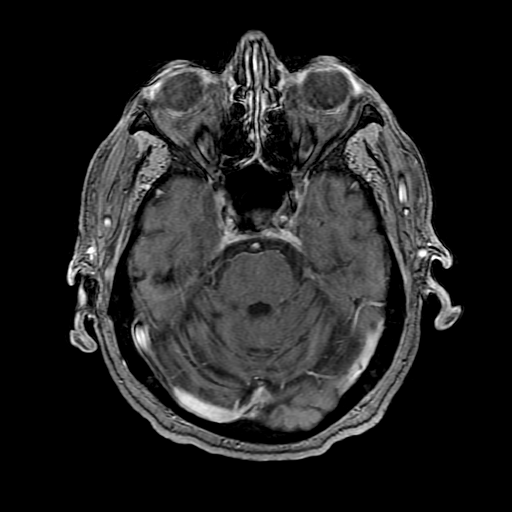
[im 20/50]
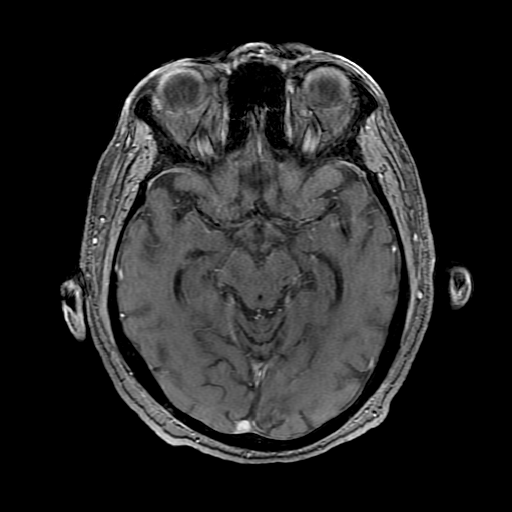
[im 25/50]
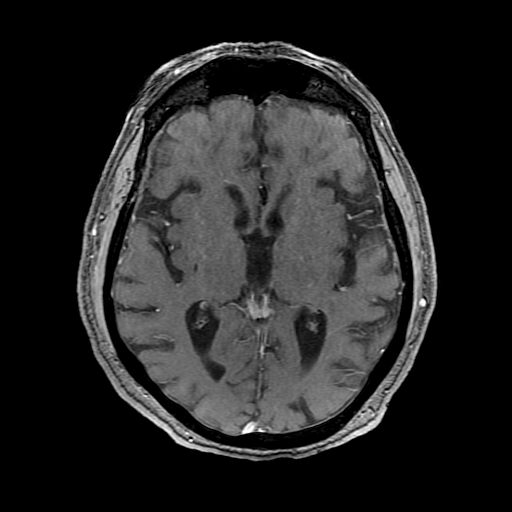
[im 30/50]
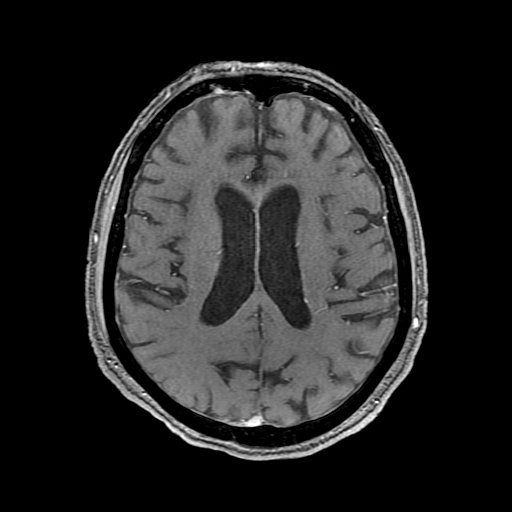
[im 35/50]
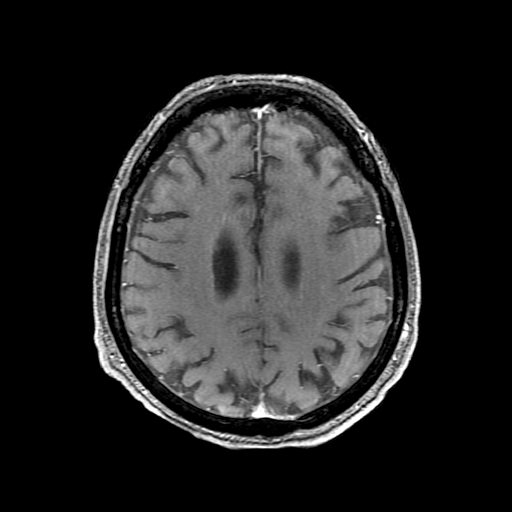
[im 40/50]
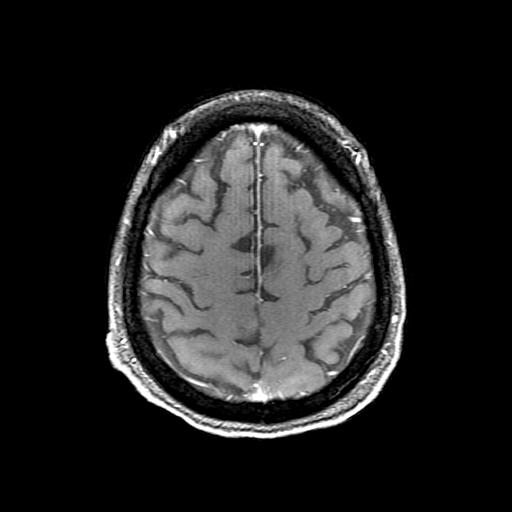
[im 45/50]
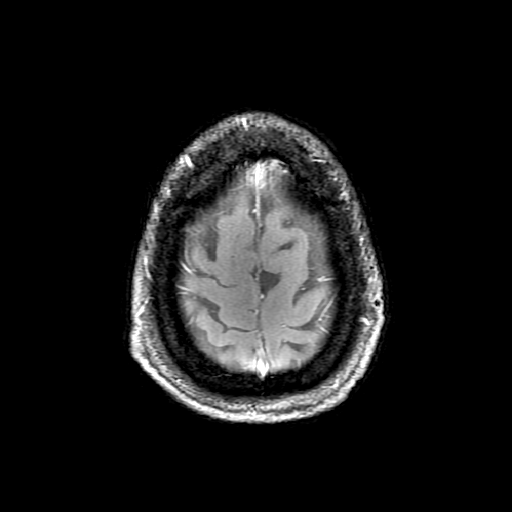

[Series 6: T1 post-contrast · coronal · 5.0mm · 0.39mm/px · 3 of 26 slices shown (2 of 3)]
[im 6/26]
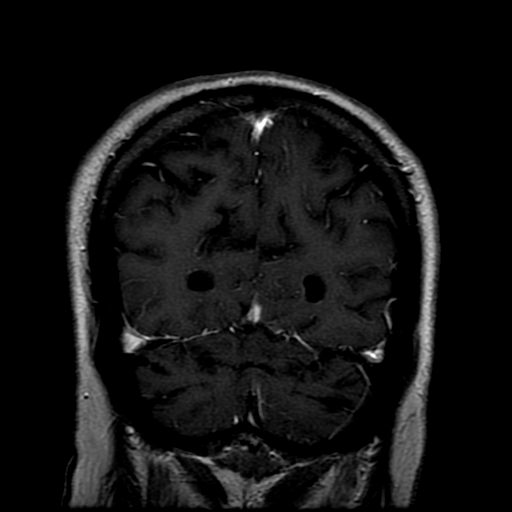
[im 16/26]
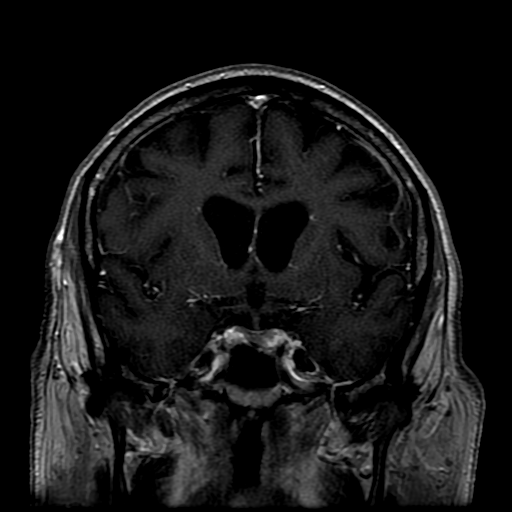
[im 26/26]
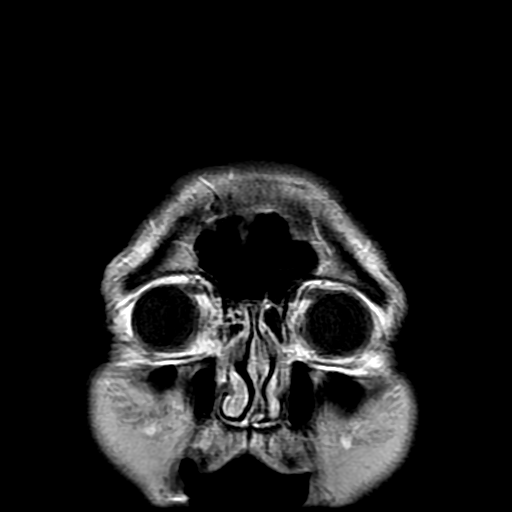

[Series 7: T1 post-contrast · sagittal · 5.0mm · 0.47mm/px · 3 of 23 slices shown (3 of 3)]
[im 1/23]
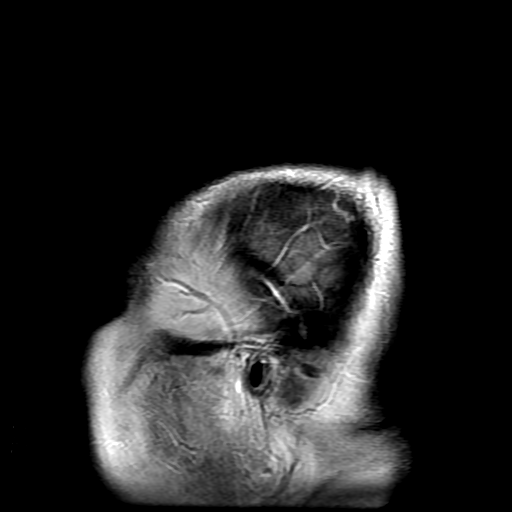
[im 12/23]
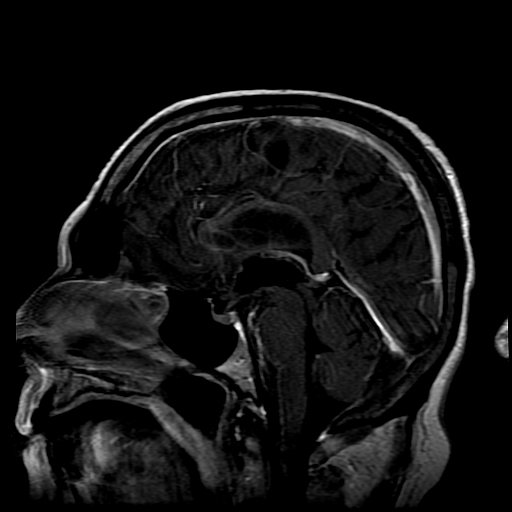
[im 23/23]
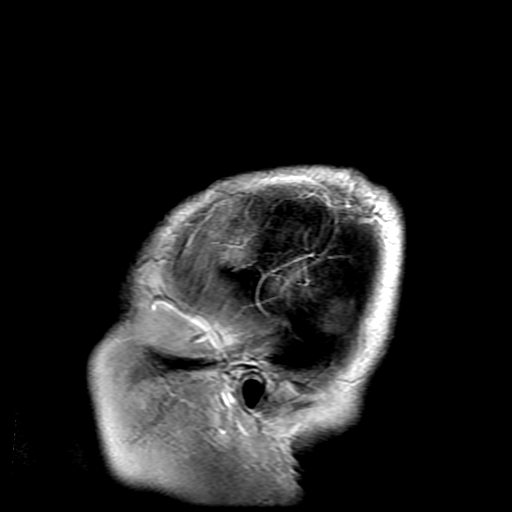

[19 of 48 positions shown; findings below may reference images not displayed]

FINDINGS: Brain: Parenchymal volume is stable. The ventricles are stable in
size. Remote infarcts are again seen in the bilateral cerebellar
hemispheres.

There is enhancement along the peel surface of the cervicomedullary
junction and pons (4-2) likely reflecting leptomeningeal disease.
There is a small focus of enhancement in the left IAC (5-11). There
is no other abnormal intracranial enhancement.

There is no mass effect or midline shift.

Vascular: The vessels are grossly unremarkable on the provided
sequences.

Skull and upper cervical spine: Normal marrow signal.

Sinuses/Orbits: Grossly unremarkable on the provided sequences.

Other: None.
IMPRESSION: 1. Enhancement along the peel surface of the cervicomedullary
junction and pons suspicious for leptomeningeal spread of malignancy
given findings on prior spine imaging. Infectious or inflammatory
etiology is possible but considered less likely. Recommend
correlation with lumbar puncture.
2. Small focus of enhancement in the left IAC may reflect
leptomeningeal disease on the [DATE] cranial nerve versus a small
schwannoma. Prior contrast enhanced imaging of the brain, if
available, would be helpful for comparison.
3. No other evidence of intracranial metastatic disease.

## 2021-08-05 MED ORDER — SODIUM CHLORIDE 0.9 % IV SOLN
2.0000 g | INTRAVENOUS | Status: AC
  Administered 2021-08-05 – 2021-08-11 (×7): 2 g via INTRAVENOUS
  Filled 2021-08-05 (×7): qty 20

## 2021-08-05 MED ORDER — GADOBUTROL 1 MMOL/ML IV SOLN
10.0000 mL | Freq: Once | INTRAVENOUS | Status: AC | PRN
  Administered 2021-08-05: 10 mL via INTRAVENOUS

## 2021-08-05 NOTE — Progress Notes (Signed)
Family Medicine Teaching Service Daily Progress Note Intern Pager: [phone]  Patient name: Ronald Sheppard Medical record number: [id number] Date of birth: 08-23-1940 Age: 81 y.o. Gender: male  Primary Care Provider: Raina Mina., MD Consultants: Neurology, urology  Code Status: Full   Pt Overview and Major Events to Date:  07/30/21 - Patient admitted 08/01/21 - Foley exchanged  Assessment and Plan:  Ronald Sheppard is a 81 y.o. male presenting with fall. PMH is significant for MI w/ pacemaker and defibrillator CAD s/p stent, cerebellar stroke, HTN, HLD, PAF, BPH  Fall   Concern for Subacute Stroke vs Cauda Equina syndrome Pt has stable weakness today (inability to plantar or dorsiflex) with extensive imaging thus far showing lesions of conus medullaris, unsure etiology. CT chest/abd/pelvis showed prostate hypertrophy versus neoplasm, elevated PSA as stated below. Per neurology, MRI Brain W contrast for malignancy r/o. Differential diagnoses including malignancy, inflammation, infection, and demyelination.  -Neuro would like MRI w/ contrast (waiting to turn off pacemaker) -LP needed as well  -Follow up after imaging -PT/OT     Urinary retention   lesion on glans   Elevated PSA  Continue foley, imaging suggesting issues with spinal cord in thoracic-lumbar region. UOP 2.2 L out over last 24 hrs. Nursing mentioned concern for abscess given pus present at tip of glans, spoke to urology who reports no concerns about this. Starting CTX for abx coverage given previous culture of glans showing e.coli and strep anginosis all susceptible to CTX. Follow up fever curve and evaluate for leukocytosis as indicated. Holding on scrotal ultrasound per urology recommendations. DRE not necessary, but may be beneficial to evaluate for nodules.  -Continue flomax 0.4 mg daily -Continue foley catheter -CTX 2g q24h   Hyponatremia Na today 127 stable from yesterday 126, continue to monitor. Hyponatremia 2/2 SIADH  (possibly related to current spinal findings).  -Fluid restriction 1500 cc daily -Salt tablets -AM BMP -work up for spinal abnormality as above   HTN BP today 100/92, long term target BP range 130-150 per neurology for stenosis in posterior circulation.  -Cardizem 120 mg daily -continue to monitor   All other conditions chronic and stable HLD Hx of Cerebellar stroke Hx of MI   CAD with stents MI in 2020 w/ stent in LAD an CX and defibrillator placed.  Hx of PAF on Eliquis   FEN/GI: fluid restriction 1200 mL  PPx: SCDs Dispo: Await MRI and LP, neuro recs   Subjective:  Pt has fairly well controlled pain in his bottom. He denies any other concerns.   Objective: Temp:  [97.6 F (36.4 C)-98.3 F (36.8 C)] 97.9 F (36.6 C) (02/01 0400) Pulse Rate:  [70-85] 84 (02/01 0605) Resp:  [17-26] 17 (02/01 0605) BP: (101-137)/(76-95) 128/76 (02/01 0400) SpO2:  [99 %-100 %] 99 % (02/01 4818) Physical Exam: General: NAD, elderly, pleasant man laying in bed  Cardiovascular: RRR, no m/g/r Respiratory: CTAB, no crackles evident  Abdomen: Nontender and nondistended  Neuro: Unable to dorsiflex or planta flex feet with ability to lift legs at the hip. Upper extremity strength intact. Appropriately responsive, fully coherent. Unable to sit upright or stand.   Laboratory: Recent Labs  Lab 07/30/21 1236 07/31/21 0056 08/01/21 0616  WBC 14.2* 15.7* 12.5*  HGB 13.6 13.8 14.9  HCT 41.4 40.7 44.1  PLT 346 338 283   Recent Labs  Lab 08/02/21 0230 08/03/21 0028 08/04/21 0227  NA 126* 126* 127*  K 3.8 4.0 3.8  CL 91* 92* 89*  CO2 25 25  25  BUN 14 14 13   CREATININE 0.72 0.73 0.71  CALCIUM 8.6* 8.6* 8.9  GLUCOSE 130* 129* 138*    CT abd/pelvis/pelvis: There is no evidence of focal pulmonary consolidation or discrete lung nodules. No significant lymphadenopathy is seen in the chest, abdomen and pelvis.   No focal abnormality is seen in the liver. There is no  significant enlargement of adrenals. There are few sclerotic densities in the bony structures as described in the body of the report suggesting bone islands or sclerotic metastatic disease. If clinically warranted, radionuclide bone scan may be considered.   There is marked enlargement of prostate suggesting prostatic hypertrophy or neoplasm. There is wall thickening in the urinary bladder which may be due to chronic outlet obstruction or cystitis. Calculi are seen in the left kidney and urinary bladder.   Gallbladder stones.  Coronary artery disease.  Erskine Emery, MD 08/05/2021, 7:36 AM PGY-1, [place] Intern pager: [phone], text pages welcome

## 2021-08-05 NOTE — Progress Notes (Signed)
Per order, Changed device settings for MRI to  VOO at 95 bpm  MRI mode/Tachy-therapies to off  Will program device back to pre-MRI settings after completion of exam, and send transmission

## 2021-08-05 NOTE — Progress Notes (Signed)
Patient ID: Ronald Sheppard, male   DOB: Apr 18, 1941, 81 y.o.   MRN: [id number]    Subjective: Called by primary team to re-evaluate scrotum and penis due to concerns of developing genital infection.  There was concern that the patient' scrotum was erythematous and concerning for infection.  No fevers.  Neurology evaluation still in process.  Objective: Vital signs in last 24 hours: Temp:  [97.6 F (36.4 C)-97.9 F (36.6 C)] 97.8 F (36.6 C) (02/01 1156) Pulse Rate:  [70-85] 81 (02/01 0839) Resp:  [17-26] 20 (02/01 1156) BP: (100-151)/(76-95) 151/86 (02/01 1156) SpO2:  [99 %-100 %] 100 % (02/01 0839)  Intake/Output from previous day: 01/31 0701 - 02/01 0700 In: 120 [P.O.:120] Out: 2250 [Urine:2250] Intake/Output this shift: No intake/output data recorded.  Physical Exam:  General: Alert and oriented GU: Scrotal exam is normal. No erythema, testes are palpably normal.  No skin lesions.  Penis demonstrates stable urethral erosion compared to evaluation over the weekend. No evidence of genital infection.  Lab Results: No results for input(s): HGB, HCT in the last 72 hours. BMET Recent Labs    08/04/21 0227 08/05/21 0949  NA 127* 128*  K 3.8 3.6  CL 89* 91*  CO2 25 27  GLUCOSE 138* 134*  BUN 13 10  CREATININE 0.71 0.69  CALCIUM 8.9 8.9   CBC Latest Ref Rng & Units 08/01/2021 07/31/2021 07/30/2021  WBC 4.0 - 10.5 K/uL 12.5(H) 15.7(H) 14.2(H)  Hemoglobin 13.0 - 17.0 g/dL 14.9 13.8 13.6  Hematocrit 39.0 - 52.0 % 44.1 40.7 41.4  Platelets 150 - 400 K/uL 283 338 346     Studies/Results: MR BRAIN WO CONTRAST  Result Date: 08/03/2021 CLINICAL DATA:  Provided history: Dizziness, persistent/recurrent, cardiac or vascular cause suspected. Stroke-like symptoms. Additional history provided: Left leg weakness for 1 week, slurred speech last Monday, history of stroke. EXAM: MRI HEAD WITHOUT CONTRAST TECHNIQUE: Multiplanar, multiecho pulse sequences of the brain and surrounding structures  were obtained without intravenous contrast. COMPARISON:  CT angiogram head/neck 07/30/2021. FINDINGS: Brain: The examination is intermittently motion degraded, limiting evaluation. Most notably, there is moderate motion degradation of the axial T2 TSE sequence and moderate/severe motion a shin of the axial T1 weighted sequence. Mild-to-moderate generalized cerebral and cerebellar atrophy. Small chronic cortical/subcortical infarct within the left occipital lobe. Mild scattered and ill-defined T2 FLAIR hyperintense signal abnormality elsewhere within the cerebral white matter, nonspecific but compatible chronic small vessel ischemic disease. Numerous chronic infarcts within the bilateral cerebellar hemispheres. There is no acute infarct. No evidence of an intracranial mass. No chronic intracranial blood products. No extra-axial fluid collection. No midline shift. Vascular: Signal abnormality within the V4 left vertebral artery corresponding with the occlusion versus severe stenosis demonstrated at this site on the CTA head/neck of 07/30/2021. Skull and upper cervical spine: No focal suspicious marrow lesion. Sinuses/Orbits: Visualized orbits show no acute finding. Trace mucosal thickening versus fluid within the bilateral ethmoid air cells. Small mucous retention cyst within the inferior left maxillary sinus. Other: Small right mastoid effusion. Trace fluid also present within the left mastoid air cells. IMPRESSION: 1. Motion degraded examination, as described. 2. The diffusion-weighted imaging is of good quality. No evidence of acute infarction. 3. No acute intracranial abnormality is identified. 4. Small chronic cortical/subcortical infarct within the left occipital lobe (PCA vascular territory). 5. Background mild chronic small-vessel ischemic changes within the cerebral white matter. 6. Numerous chronic infarcts within the bilateral cerebellar hemispheres. 7. Mild-to-moderate generalized cerebral and cerebellar  atrophy. 8. Signal abnormality within  the V4 left vertebral artery corresponding with the occlusion versus severe stenosis demonstrated at this site on the CTA head/neck of 07/30/2021. 9. Small right mastoid effusion. Electronically Signed   By: Kellie Simmering D.O.   On: 08/03/2021 16:23   MR BRAIN W CONTRAST  Result Date: 08/05/2021 CLINICAL DATA:  Left leg weakness and numbness, concern for malignancy EXAM: MRI HEAD WITH CONTRAST TECHNIQUE: Multiplanar, multiecho pulse sequences of the brain and surrounding structures were obtained with intravenous contrast. CONTRAST:  29mL GADAVIST GADOBUTROL 1 MMOL/ML IV SOLN COMPARISON:  Brain MRI 08/03/2021 FINDINGS: Brain: Parenchymal volume is stable. The ventricles are stable in size. Remote infarcts are again seen in the bilateral cerebellar hemispheres. 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 There is a small focus of enhancement in the left IAC (5-11). There is no other abnormal intracranial enhancement. There is no mass effect or midline shift. Vascular: The vessels are grossly unremarkable on the provided sequences. Skull and upper cervical spine: Normal marrow signal. Sinuses/Orbits: Grossly unremarkable on the provided sequences. Other: None. IMPRESSION: 1. Enhancement along the peel surface of the cervicomedullary junction and pons suspicious for leptomeningeal spread of malignancy given findings on prior spine imaging. Infectious or inflammatory etiology is possible but considered less likely. Recommend correlation with lumbar puncture. 2. Small focus of enhancement in the left IAC may reflect leptomeningeal disease on the 7/8 cranial nerve versus a small schwannoma. Prior contrast enhanced imaging of the brain, if available, would be helpful for comparison. 3. No other evidence of intracranial metastatic disease. Electronically Signed   By: Valetta Mole M.D.   On:  08/05/2021 14:32   MR THORACIC SPINE WO CONTRAST  Result Date: 08/03/2021 CLINICAL DATA:  Midthoracic region back pain. Left leg weakness over the last week. EXAM: MRI THORACIC SPINE WITHOUT CONTRAST TECHNIQUE: Multiplanar, multisequence MR imaging of the thoracic spine was performed. No intravenous contrast was administered. COMPARISON:  CT 07/17/2021 FINDINGS: Alignment:  No thoracic malalignment. Vertebrae: No fracture or focal bone lesion. As better shown by CT, there are solid bridging osteophytes anteriorly throughout the thoracic region. Cord: Patchy abnormal T2 signal is seen scattered within the thoracic cord beginning at the T3 level extending all the way to the conus tip. This is particularly pronounced in the distal thoracic cord at the T11 and T12 levels where the cord shows some swelling. Paraspinal and other soft tissues: Negative except for cholelithiasis. Disc levels: No disc level pathology. No stenosis of the canal or foramina. Minimal non-compressive disc bulge at T3-4. Mild facet and ligamentous hypertrophy in the lower thoracic region but without compressive effect upon the canal. IMPRESSION: Scattered areas of abnormal T2 signal within the thoracic spinal cord from T3 to the conus tip. This is particularly advanced at T11 and T12 where there is cord swelling. Differential diagnosis is that of demyelinating disease versus ischemic myelopathy. I spoke with the technologist who is in the process of performing the lumbar scan. We are going to get contrast administered so we can see the distal cord. Electronically Signed   By: Nelson Chimes M.D.   On: 08/03/2021 16:31   MR Lumbar Spine W Wo Contrast  Result Date: 08/03/2021 CLINICAL DATA:  Spinal stenosis, lumbar EXAM: MRI LUMBAR SPINE WITHOUT AND WITH CONTRAST TECHNIQUE: Multiplanar and multiecho pulse sequences of the lumbar spine were obtained without and with intravenous contrast. CONTRAST:  19mL GADAVIST GADOBUTROL 1 MMOL/ML IV SOLN  COMPARISON:  CT  lumbar spine 07/30/2021. FINDINGS: Segmentation:  Standard. Alignment:  Normal. Vertebrae: Mild degenerative/discogenic endplate signal changes at L3-L4 and L4-L5 posteriorly with Schmorl's nodes. Otherwise, no focal marrow edema chest acute fracture discitis/osteomyelitis. Mildly heterogeneous bone marrow without suspicious bone lesion. Conus medullaris and cauda equina: Conus extends to the L1 level. Abnormal T2 hyperintense signal within the conus. Patchy enhancement in the conus and areas of the lower thoracic cord, which appears to be predominantly pial. There is also abnormal diffuse enhancement of the cauda quinine nerve roots. Paraspinal and other soft tissues: Unremarkable. Disc levels: T12-L1: No significant disc protrusion, foraminal stenosis, or canal stenosis. L1-L2: Mild disc bulging and facet arthropathy without significant stenosis. L2-L3: Disc bulging, eccentric to the right. Right greater than left facet arthropathy. Resulting moderate right and mild left foraminal stenosis. Mild canal stenosis with right greater than left subarticular recess narrowing. L3-L4: Disc bulging and endplate spurring. Bilateral facet arthropathy. Ligamentum flavum thickening. Resulting moderate bilateral foraminal stenosis and moderate canal stenosis with bilateral subarticular recess narrowing. L4-L5: Right eccentric disc bulge. Moderate bilateral facet arthropathy. Ligamentum flavum thickening. Resulting moderate right greater than left foraminal stenosis and mild canal stenosis with right greater than left subarticular recess narrowing. L5-S1: Right eccentric disc bulge with bilateral facet arthropathy. Resulting severe right and moderate left foraminal stenosis. No significant canal stenosis. Narrowing of the right subarticular recess. IMPRESSION: 1. Abnormal signal within the conus with extensive abnormal enhancement in the conus, visualized lower thoracic cord, and the cauda equina nerve roots  diffusely. Differential considerations include malignancy (including CSF disseminated metastases), inflammation (including neurosarcoidosis, although there were no findings to suggest sarcoidosis on recent CT chest), infection (including atypical etiologies), and demyelination (including Guillain-Barr Syndrome). Multiple sclerosis is thought less likely given patient age/sex and extensive leptomeningeal/cauda equina enhancement. Recommend correlation with lumbar puncture. Also, postcontrast imaging of the brain may be worthwhile to evaluate for abnormal intracranial enhancement. 2. At L5-S1, severe right and moderate left foraminal stenosis. 3. At L3-L4, moderate canal and bilateral foraminal stenosis. 4. At L4-L5 moderate right greater than left foraminal stenosis and mild canal stenosis. 5. At L2-L3, moderate right and mild left foraminal stenosis. Electronically Signed   By: Margaretha Sheffield M.D.   On: 08/03/2021 18:00   CT CHEST ABDOMEN PELVIS W CONTRAST  Result Date: 08/04/2021 CLINICAL DATA:  Shortness of breath, evaluate for metastatic disease EXAM: CT CHEST, ABDOMEN, AND PELVIS WITH CONTRAST TECHNIQUE: Multidetector CT imaging of the chest, abdomen and pelvis was performed following the standard protocol during bolus administration of intravenous contrast. RADIATION DOSE REDUCTION: This exam was performed according to the departmental dose-optimization program which includes automated exposure control, adjustment of the mA and/or kV according to patient size and/or use of iterative reconstruction technique. CONTRAST:  14mL OMNIPAQUE IOHEXOL 300 MG/ML  SOLN COMPARISON:  CT chest done on 07/17/2021 FINDINGS: CT CHEST FINDINGS Cardiovascular: Coronary artery calcifications and possible coronary artery stent are noted. There is homogeneous enhancement in thoracic aorta. Ascending thoracic aorta measures 4 cm. There are no intraluminal filling defects in the main pulmonary artery branches in the  mediastinum. Mediastinum/Nodes: There are subcentimeter nodes in the mediastinum and hilar regions. There are calcified nodes in the subcarinal region of mediastinum and right hilum. Lungs/Pleura: There is no focal pulmonary consolidation. There are no discrete lung nodules. There are small linear densities in both lower lung fields. This may suggest scarring or subsegmental atelectasis. There is no pleural effusion or pneumothorax. Musculoskeletal: Unremarkable. CT ABDOMEN PELVIS FINDINGS Hepatobiliary: No focal abnormality  is seen in the liver. There are calcified gallbladder stones. Pancreas: No focal abnormality is seen. Spleen: There are calcified granulomas in the spleen. Adrenals/Urinary Tract: Adrenals are unremarkable. There is no hydronephrosis. There are few left renal stones largest measuring 9 mm. There is no significant dilation of the ureters. No definite calcific densities seen in the courses of the ureters. There is mild diffuse wall thickening in the urinary bladder. Stomach/Bowel: Stomach is unremarkable. Small bowel loops are not dilated. Appendix is not dilated. There is no focal pericecal inflammation. There is no significant wall thickening in colon. There is no pericolic stranding or fluid collection. Vascular/Lymphatic: Scattered arterial calcifications are seen. No significant lymphadenopathy seen Reproductive: There is marked enlargement of prostate. There is low-density tubular structure in the course of the urethra. There is 6 mm calcific density in the dependent portion of urinary bladder close to the urethrovesical junction. Other: There is no ascites or pneumoperitoneum. Musculoskeletal: No focal lytic lesions are seen. In image 96 of series 3, there are small sclerotic densities in the right iliac bone and right pedicle of L5 vertebra. Small sclerotic densities seen in the neck of left femur. There is small sclerotic density in body of C6 vertebra. Degenerative changes are noted with  bony spurs, encroachment of neural foramina and spinal stenosis in the lumbar spine. IMPRESSION: There is no evidence of focal pulmonary consolidation or discrete lung nodules. No significant lymphadenopathy is seen in the chest, abdomen and pelvis. No focal abnormality is seen in the liver. There is no significant enlargement of adrenals. There are few sclerotic densities in the bony structures as described in the body of the report suggesting bone islands or sclerotic metastatic disease. If clinically warranted, radionuclide bone scan may be considered. There is marked enlargement of prostate suggesting prostatic hypertrophy or neoplasm. There is wall thickening in the urinary bladder which may be due to chronic outlet obstruction or cystitis. Calculi are seen in the left kidney and urinary bladder. Gallbladder stones.  Coronary artery disease. Other findings as described in the body of the report. Electronically Signed   By: Elmer Picker M.D.   On: 08/04/2021 12:03    Assessment/Plan: 1) Urinary retention:  May be able to consider voiding trial during hospitalization although will have to see if he shows clinical improvement in his neurologic status.  If not, may be best to continue catheter drainage and consider outpatient urodynamic evaluation.  He does have BPH based on CT imaging yesterday.  2) Urethral erosion:  Continue to maintain catheter off tension at urethral meatus to prevent further erosion.  There is no evidence of genital infection.  He does not need a scrotal ultrasound (exam is normal) and does not need antibiotics.    LOS: 4 days   Dutch Gray 08/05/2021, 3:01 PM

## 2021-08-05 NOTE — Progress Notes (Signed)
RN called to room by NT. RN made aware of new purulent fluid around scrotal area and at the head of the penis. Area was cleaned and MD made aware.

## 2021-08-05 NOTE — Progress Notes (Addendum)
FPTS Interim Progress Note  Updated Dr. Alinda Money regarding new purulent fluid and scrotal swelling this morning, he will see him later today. Appreciate continued involvement and recommendations, concern for possible neurogenic bladder. Primary team has started on ceftriaxone and awaiting scrotal ultrasound.   Spoke to Dr. Alinda Money who was able to evaluate the patient and actually notices an improvement compared to days prior. He recommends against scrotal ultrasound which is cancelled. Likely patient has a neurogenic changes, awaiting further neurology recommendations as workup continues and urology will continue to follow. Appreciate recommendations.   Donney Dice, DO 08/05/2021, 11:50 AM PGY-2, [place] Service pager [phone]

## 2021-08-05 NOTE — Progress Notes (Signed)
OT Cancellation Note  Patient Details Name: Ronald Sheppard MRN: [id number] DOB: 09/03/1940   Cancelled Treatment:    Reason Eval/Treat Not Completed: Patient at procedure or test/ unavailable. Pt off unit for MRI, OT will follow up next available time  Britt Bottom 08/05/2021, 2:36 PM

## 2021-08-05 NOTE — Progress Notes (Signed)
FPTS Interim Progress Note Spoke to daughter and patient separately (daughter by phone and patient in room) about MRI findings below. Will follow up with neurology regarding next steps, need for LP. Patient and daughter are aware and all questions answered.   Appreciate urology recommendations and have cancelled scrotal u/s.   MR BRAIN W CONTRAST  Result Date: 08/05/2021 CLINICAL DATA:  Left leg weakness and numbness, concern for malignancy EXAM: MRI HEAD WITH CONTRAST TECHNIQUE: Multiplanar, multiecho pulse sequences of the brain and surrounding structures were obtained with intravenous contrast. CONTRAST:  46mL GADAVIST GADOBUTROL 1 MMOL/ML IV SOLN COMPARISON:  Brain MRI 08/03/2021 FINDINGS: Brain: Parenchymal volume is stable. The ventricles are stable in size. Remote infarcts are again seen in the bilateral cerebellar hemispheres. 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 There is a small focus of enhancement in the left IAC (5-11). There is no other abnormal intracranial enhancement. There is no mass effect or midline shift. Vascular: The vessels are grossly unremarkable on the provided sequences. Skull and upper cervical spine: Normal marrow signal. Sinuses/Orbits: Grossly unremarkable on the provided sequences. Other: None. IMPRESSION: 1. Enhancement along the peel surface of the cervicomedullary junction and pons suspicious for leptomeningeal spread of malignancy given findings on prior spine imaging. Infectious or inflammatory etiology is possible but considered less likely. Recommend correlation with lumbar puncture. 2. Small focus of enhancement in the left IAC may reflect leptomeningeal disease on the 7/8 cranial nerve versus a small schwannoma. Prior contrast enhanced imaging of the brain, if available, would be helpful for comparison. 3. No other evidence of intracranial metastatic disease. Electronically  Signed   By: Valetta Mole M.D.   On: 08/05/2021 14:32     Plan:  Likely cont with LP HDS

## 2021-08-05 NOTE — Progress Notes (Signed)
Neurology Team PROGRESS NOTE   INTERVAL HISTORY Patient seen and assessed at bedside. Patient proximal LLE weakness improving. Patient reports feeling more tired than usual. Remainder of neuro exam largely unchanged.  Awaiting MRI with contrast to be done. Has conditional pacemaker. Patient requesting Ativan prior to MRI. Plan for LP once MRI w/ contrast completed.   Vitals:   08/05/21 0400 08/05/21 0605 08/05/21 0839 08/05/21 1156  BP: 128/76  (!) 100/92 (!) 151/86  Pulse: 82 84 81   Resp: (!) 26 17 19 20   Temp: 97.9 F (36.6 C)  97.6 F (36.4 C) 97.8 F (36.6 C)  TempSrc: Oral  Axillary Axillary  SpO2: 100% 99% 100%    CBC:  Recent Labs  Lab 07/31/21 0056 08/01/21 0616  WBC 15.7* 12.5*  HGB 13.8 14.9  HCT 40.7 44.1  MCV 85.7 84.5  PLT 338 096    Basic Metabolic Panel:  Recent Labs  Lab 08/04/21 0227 08/05/21 0949  NA 127* 128*  K 3.8 3.6  CL 89* 91*  CO2 25 27  GLUCOSE 138* 134*  BUN 13 10  CREATININE 0.71 0.69  CALCIUM 8.9 8.9    Lipid Panel:  Recent Labs  Lab 07/31/21 1247  CHOL 118  TRIG 45  HDL 60  CHOLHDL 2.0  VLDL 9  LDLCALC 49    HgbA1c:  Recent Labs  Lab 07/31/21 0056  HGBA1C 6.2*    Urine Drug Screen:  Recent Labs  Lab 07/31/21 1020  LABOPIA POSITIVE*  COCAINSCRNUR NONE DETECTED  LABBENZ NONE DETECTED  AMPHETMU NONE DETECTED  THCU NONE DETECTED  LABBARB NONE DETECTED     Alcohol Level No results for input(s): ETH in the last 168 hours.  IMAGING past 24 hours No results found.  PHYSICAL EXAM  Temp:  [97.6 F (36.4 C)-97.9 F (36.6 C)] 97.8 F (36.6 C) (02/01 1156) Pulse Rate:  [70-85] 81 (02/01 0839) Resp:  [17-26] 20 (02/01 1156) BP: (100-151)/(76-95) 151/86 (02/01 1156) SpO2:  [99 %-100 %] 100 % (02/01 0839)  General - Well nourished, well developed pleasant elderly male, in no apparent distress.  Cardiovascular - Tachycardic (afib).  Mental Status -  Level of arousal and orientation to time, place, and  person were intact. No aphasia. Speech is more clear today.  Cranial Nerves II - XII - II - Visual field intact OU. III, IV, VI - Extraocular movements intact. VII - Facial movement intact bilaterally. VIII - Hearing & vestibular intact bilaterally.  XII - Tongue protrusion intact.  Motor Strength - LLE significantly weaker than right 2/5 proximally and 0/5 distally.  LUE/RLE/RUE 5/5  Motor Tone - Muscle tone was assessed at the neck and appendages and was normal. Reflexes - The patients reflexes were symmetrical in all extremities and he had no pathological reflexes. Sensory - Reports difference in sensation to light touch in LLE from hip down Coordination - The patient had normal movements in the hands and feet with no ataxia or dysmetria.  Tremor was absent. Gait and Station - deferred.  ASSESSMENT/PLAN [name] is a 81 y.o. male with history of MI w/ ICD, CAD s/p stent, cerebellar stroke, HTN, HLD, PAF noncompliant with eliquis due to cost, BPH presenting with several days of left sided weakness. Lumbar and Thoracic spine MRI concerning for malignancy vs inflammation vs infection.   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 No occlusion or hemodynamically significant stenosis in the neck.   2D Echo EF 45-50% LDL 49 HgbA1c 6.2 VTE prophylaxis - Eliquis No antithrombotic prior to admission, now on Eliquis 5mg  bid. Therapy recommendations: SNF Disposition:  pending Lumbar MRI concerning for abnormal signal enhancement in the conus and cauda equina as well as L5-s1, L3-L4, L4-5, L2-L3 foraminal stenosis. Thoracic MRI also concerning for scattered abnormal T2 signal from T3 to conus tip.  MRI brain shows no evidence of acute  intracranial abnormalities suggesting leg weakness likely related to spinal findings and not due to an acute infarct in the brain. CT Lung, Abdomen and Pelvis: There is no evidence of focal pulmonary consolidation or discrete lung nodules. No significant lymphadenopathy is seen in the chest, abdomen and pelvis. No abnormalities noted on liver or adrenal enlargement. Large prostate concerning for BPH or neoplasm.   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 After hours, contact General Neurology

## 2021-08-06 DIAGNOSIS — R531 Weakness: Secondary | ICD-10-CM | POA: Diagnosis not present

## 2021-08-06 DIAGNOSIS — G834 Cauda equina syndrome: Secondary | ICD-10-CM | POA: Diagnosis not present

## 2021-08-06 DIAGNOSIS — C7949 Secondary malignant neoplasm of other parts of nervous system: Secondary | ICD-10-CM

## 2021-08-06 DIAGNOSIS — Z950 Presence of cardiac pacemaker: Secondary | ICD-10-CM | POA: Diagnosis not present

## 2021-08-06 LAB — BASIC METABOLIC PANEL
Anion gap: 11 (ref 5–15)
BUN: 10 mg/dL (ref 8–23)
CO2: 25 mmol/L (ref 22–32)
Calcium: 8.6 mg/dL — ABNORMAL LOW (ref 8.9–10.3)
Chloride: 91 mmol/L — ABNORMAL LOW (ref 98–111)
Creatinine, Ser: 0.62 mg/dL (ref 0.61–1.24)
GFR, Estimated: >60 mL/min (ref >60)
Glucose, Bld: 120 mg/dL — ABNORMAL HIGH (ref 70–99)
Potassium: 3.8 mmol/L (ref 3.5–5.1)
Sodium: 127 mmol/L — ABNORMAL LOW (ref 135–145)

## 2021-08-06 LAB — CBC
HCT: 38.7 % — ABNORMAL LOW (ref 39.0–52.0)
Hemoglobin: 13.5 g/dL (ref 13.0–17.0)
MCH: 28.9 pg (ref 26.0–34.0)
MCHC: 34.9 g/dL (ref 30.0–36.0)
MCV: 82.9 fL (ref 80.0–100.0)
Platelets: 287 10*3/uL (ref 150–400)
RBC: 4.67 MIL/uL (ref 4.22–5.81)
RDW: 13 % (ref 11.5–15.5)
WBC: 11.3 10*3/uL — ABNORMAL HIGH (ref 4.0–10.5)
nRBC: 0 % (ref 0.0–0.2)

## 2021-08-06 MED ORDER — HEPARIN SODIUM (PORCINE) 5000 UNIT/ML IJ SOLN
5000.0000 [IU] | Freq: Three times a day (TID) | INTRAMUSCULAR | Status: DC
  Administered 2021-08-07: 5000 [IU] via SUBCUTANEOUS
  Filled 2021-08-06: qty 1

## 2021-08-06 NOTE — Progress Notes (Signed)
Occupational Therapy Treatment Patient Details Name: Ronald Sheppard MRN: [id number] DOB: January 24, 1941 Today's Date: 08/06/2021   History of present illness Pt is 81 yo male admitted on 07/30/21-- per family and neurology - pt admitted to [facility]  last week for CVA and was sent to rehab where he developed further back pain, L sided weakness, and slurred speech so sent back to hospital. 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 Neurology consult suspicious for malignancy and spinal tap pending.   Pt with hx including MI with AICD, CAD s/p stent, cerebellar CVA, HTN, HLD, PAF, BPH, and recent CVA per family   OT comments  Pt seen for co tx with PT. Pt making progress with functional goals. Session focused sitting EOB, sitting tolerance/balance (pt sat EOB ~ 15 minutes), oral care, UB bathing, UB dressing. Pt with Poor siting balance, leaning to R with min A - max A to steady/balance. OT will continue to follow acutely to maximize level of function and safety   Recommendations for follow up therapy are one component of a multi-disciplinary discharge planning process, led by the attending physician.  Recommendations may be updated based on patient status, additional functional criteria and insurance authorization.    Follow Up Recommendations  Skilled nursing-short term rehab (<3 hours/day)    Assistance Recommended at Discharge Frequent or constant Supervision/Assistance  Patient can return home with the following  Two people to help with walking and/or transfers;A lot of help with bathing/dressing/bathroom;Assistance with cooking/housework;Direct  supervision/assist for medications management;Direct supervision/assist for financial management;Assist for transportation;Help with stairs or ramp for entrance   Equipment Recommendations  Other (comment) (TBD at SNF)    Recommendations for Other Services      Precautions / Restrictions Precautions Precautions: Fall Restrictions Weight Bearing Restrictions: No       Mobility Bed Mobility Overal bed mobility: Needs Assistance Bed Mobility: Rolling, Sidelying to Sit, Sit to Sidelying Rolling: Max assist Sidelying to sit: Max assist, +2 for physical assistance     Sit to sidelying: Total assist, +2 for physical assistance General bed mobility comments: pt reporting pain in low back with mobility which causes him to partially freeze and requires assist to continue and complete movements. Became fatigued with EOB activities and total assist to return to side to supine    Transfers                   General transfer comment: poor balance in sitting with poor righting of trunk; unsafe to attempt     Balance Overall balance assessment: Needs assistance Sitting-balance support: Bilateral upper extremity supported, Feet supported Sitting balance-Leahy Scale: Poor Sitting balance - Comments: initially min assist, but progressed to max assist as he fatigued       Standing balance comment: unsafe to attempt                           ADL either performed or assessed with clinical judgement   ADL Overall ADL's : Needs assistance/impaired     Grooming: Wash/dry hands;Wash/dry face;Oral care;Sitting;Min guard Grooming Details (indicate cue type and reason): sitting EOB, min - max for balance, R leaning Upper Body Bathing: Minimal  assistance;Sitting       Upper Body Dressing : Minimal assistance;Sitting                   Functional mobility during ADLs: Moderate assistance;+2 for physical assistance;+2 for safety/equipment General ADL Comments: Pt  begins to lose his balance when engaged in ADL tasks with poor awareness and no righting reactions; difficulty donning right sleeve of gown over RUE when handed to him (tried to turn gown around)    Extremity/Trunk Assessment Upper Extremity Assessment Upper Extremity Assessment: Generalized weakness   Lower Extremity Assessment Lower Extremity Assessment: Defer to PT evaluation RLE Deficits / Details: ROM WFL; MMT in supine: ankle 4/5, hip and knee 3/5 RLE Sensation: decreased light touch LLE Deficits / Details: ROM WFL; MMT in supine: ankle 0/5, knee 1/5, hip 2/5 LLE Sensation: decreased light touch   Cervical / Trunk Assessment Cervical / Trunk Assessment: Other exceptions Cervical / Trunk Exceptions: back pain    Vision Baseline Vision/History: 1 Wears glasses Ability to See in Adequate Light: 0 Adequate Patient Visual Report: No change from baseline     Perception     Praxis      Cognition Arousal/Alertness: Awake/alert Behavior During Therapy: WFL for tasks assessed/performed Overall Cognitive Status: No family/caregiver present to determine baseline cognitive functioning Area of Impairment: Orientation                     Memory: Decreased short-term memory       Problem Solving: Slow processing, Difficulty sequencing, Requires verbal cues, Requires tactile cues General Comments: HOH results in need for repetition of some instructions; pt able to state he's too weak to try standing, however in sitting begins to lose his balance when engaged in ADL tasks with poor awareness and no righting reactions; difficulty donning right sleeve of gown over RUE when handed to him (tried to turn gown around)        Exercises      Shoulder Instructions       General Comments      Pertinent Vitals/ Pain       Pain Assessment Pain Assessment: Faces Faces Pain Scale: Hurts whole lot Pain Location: low back with mobility Pain Descriptors / Indicators: Grimacing,  Moaning, Discomfort Pain Intervention(s): Limited activity within patient's tolerance, Monitored during session, Repositioned  Home Living Family/patient expects to be discharged to:: Skilled nursing facility Living Arrangements: Alone Available Help at Discharge: Family;Available PRN/intermittently Type of Home: House Home Access: Stairs to enter [employer] of Steps: 6-8 Entrance Stairs-Rails: Can reach both Home Layout: Multi-level;Able to live on main level with bedroom/bathroom;Full bath on main level     Bathroom Shower/Tub: Walk-in shower     Bathroom Accessibility: No   Home Equipment: Conservation officer, nature (2 wheels);Cane - single point;Shower seat          Prior Functioning/Environment              Frequency  Min 2X/week        Progress Toward Goals  OT Goals(current goals can now be found in the care plan section)  Progress towards OT goals: Progressing toward goals     Plan Discharge plan remains appropriate;Frequency remains appropriate    Co-evaluation    PT/OT/SLP Co-Evaluation/Treatment: Yes Reason for Co-Treatment: Complexity of the patient's impairments (multi-system involvement);For patient/therapist safety;To address functional/ADL transfers PT goals addressed during session: Mobility/safety with mobility;Balance;Strengthening/ROM OT goals addressed during session: ADL's and self-care      AM-PAC OT "  6 Clicks" Daily Activity     Outcome Measure   Help from another person eating meals?: None Help from another person taking care of personal grooming?: A Little Help from another person toileting, which includes using toliet, bedpan, or urinal?: Total Help from another person bathing (including washing, rinsing, drying)?: A Lot Help from another person to put on and taking off regular upper body clothing?: A Little Help from another person to put on and taking off regular lower body clothing?: Total 6 Click Score: 14    End of  Session    OT Visit Diagnosis: Other abnormalities of gait and mobility (R26.89);Muscle weakness (generalized) (M62.81);Pain;Other symptoms and signs involving cognitive function Pain - part of body:  (back)   Activity Tolerance Patient limited by fatigue   Patient Left in bed;with call bell/phone within reach   Nurse Communication          Time: 0092-3300 OT Time Calculation (min): 28 min  Charges: OT General Charges $OT Visit: 1 Visit OT Treatments $Self Care/Home Management : 8-22 mins    Britt Bottom 08/06/2021, 1:39 PM

## 2021-08-06 NOTE — Progress Notes (Signed)
Physical Therapy Treatment Patient Details Name: Ronald Sheppard MRN: [id number] DOB: Nov 24, 1940 Today's Date: 08/06/2021   History of Present Illness Pt is 81 yo male admitted on 07/30/21-- per family and neurology - pt admitted to [facility]  last week for CVA and was sent to rehab where he developed further back pain, L sided weakness, and slurred speech so sent back to hospital. 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 Neurology consult suspicious for malignancy and spinal tap pending.   Pt with hx including MI with AICD, CAD s/p stent, cerebellar CVA, HTN, HLD, PAF, BPH, and recent CVA per family    PT Comments    Patient seen in co-treatment with OT to progress to sitting EOB. Patient initially with better-than-anticipated trunk control with slight lean to right and min assist to remain upright in sitting. However, as pt fatigued while performing ADL tasks with OT (also a component of inability to maintain his balance when his focus was directed to another task), pt progressed to needing max assist to maintain sitting EOB. Predominantly right and posterior lean. Patient EOB ~15 minutes.     Recommendations for follow up therapy are one component of a multi-disciplinary discharge planning process, led by the attending physician.  Recommendations may be updated based on patient status, additional functional criteria and insurance authorization.  Follow Up Recommendations  Skilled nursing-short term rehab (<3 hours/day)     Assistance Recommended at Discharge Frequent or constant Supervision/Assistance  Patient can return home with the  following Two people to help with walking and/or transfers;Two people to help with bathing/dressing/bathroom;Assist for transportation;Assistance with cooking/housework   Equipment Recommendations  Other (comment) (defer to post acute)    Recommendations for Other Services       Precautions / Restrictions Precautions Precautions: Fall Restrictions Weight Bearing Restrictions: No     Mobility  Bed Mobility Overal bed mobility: Needs Assistance Bed Mobility: Rolling, Sidelying to Sit, Sit to Sidelying Rolling: Max assist Sidelying to sit: Max assist, +2 for physical assistance     Sit to sidelying: Total assist, +2 for physical assistance General bed mobility comments: pt reporting pain in low back with mobility which causes him to partially freeze and requires assist to continue and complete movements. Became fatigued with EOB activities and total assist to return to side to supine    Transfers                   General transfer comment: poor balance in sitting with poor righting of trunk; unsafe to attempt    Ambulation/Gait                   Stairs             Wheelchair Mobility    Modified Rankin (Stroke Patients Only) Modified Rankin (Stroke Patients Only) Pre-Morbid Rankin Score: Slight disability Modified Rankin: Severe disability     Balance Overall balance assessment: Needs assistance Sitting-balance support: Bilateral upper extremity supported, Feet supported Sitting balance-Leahy Scale: Zero Sitting balance - Comments: initially min assist, but progressed to max assist as he fatigued       Standing balance comment: unsafe to attempt  Cognition Arousal/Alertness: Awake/alert Behavior During Therapy: WFL for tasks assessed/performed Overall Cognitive Status: No family/caregiver present to determine baseline cognitive functioning Area of Impairment: Problem solving, Awareness                            Awareness: Intellectual Problem Solving: Slow processing, Difficulty sequencing, Requires verbal cues, Requires tactile cues General Comments: HOH results in need for repetition of some instructions; pt able to state he's too weak to try standing, however in sitting begins to lose his balance when engaged in ADL tasks with poor awareness and no righting reactions; difficulty donning right sleeve of gown over RUE when handed to him (tried to turn gown around)        Exercises      General Comments        Pertinent Vitals/Pain Pain Assessment Pain Assessment: Faces Faces Pain Scale: Hurts whole lot Pain Location: low back with mobility Pain Descriptors / Indicators: Grimacing, Moaning, Discomfort Pain Intervention(s): Limited activity within patient's tolerance, Monitored during session, Repositioned    Home Living Family/patient expects to be discharged to:: Skilled nursing facility Living Arrangements: Alone Available Help at Discharge: Family;Available PRN/intermittently Type of Home: House Home Access: Stairs to enter Entrance Stairs-Rails: Can reach both Entrance Stairs-Number of Steps: 6-8   Home Layout: Multi-level;Able to live on main level with bedroom/bathroom;Full bath on main level Home Equipment: Rolling Walker (2 wheels);Cane - single point;Shower seat      Prior Function            PT Goals (current goals can now be found in the care plan section) Acute Rehab PT Goals Patient Stated Goal: decrease pain ; daughter in agreement with return to SNF Time For Goal Achievement: 08/14/21 Potential to Achieve Goals: Fair Progress towards PT goals: Progressing toward goals    Frequency    Min 2X/week      PT Plan Current plan remains appropriate;Frequency needs to be updated    Co-evaluation PT/OT/SLP Co-Evaluation/Treatment: Yes Reason for Co-Treatment: Complexity of the patient's impairments (multi-system involvement);For  patient/therapist safety;To address functional/ADL transfers PT goals addressed during session: Mobility/safety with mobility;Balance;Strengthening/ROM        AM-PAC PT "6 Clicks" Mobility   Outcome Measure  Help needed turning from your back to your side while in a flat bed without using bedrails?: A Lot Help needed moving from lying on your back to sitting on the side of a flat bed without using bedrails?: Total Help needed moving to and from a bed to a chair (including a wheelchair)?: Total Help needed standing up from a chair using your arms (e.g., wheelchair or bedside chair)?: Total Help needed to walk in hospital room?: Total Help needed climbing 3-5 steps with a railing? : Total 6 Click Score: 7    End of Session   Activity Tolerance: Patient limited by fatigue Patient left: in bed;with call bell/phone within reach;with bed alarm set Nurse Communication: Mobility status;Need for lift equipment PT Visit Diagnosis: Other abnormalities of gait and mobility (R26.89);Hemiplegia and hemiparesis;Muscle weakness (generalized) (M62.81)     Time: 1020-1047 PT Time Calculation (min) (ACUTE ONLY): 27 min  Charges:  $Therapeutic Activity: 8-22 mins                      Arby Barrette, PT Acute Rehabilitation Services  Pager [phone] Office [phone]    Rexanne Mano 08/06/2021, 11:18 AM

## 2021-08-06 NOTE — Progress Notes (Signed)
FPTS Brief Progress Note  S: Pt sleeping    O: BP 103/65 (BP Location: Left Arm)    Pulse 85    Temp 97.6 F (36.4 C) (Axillary)    Resp 19    SpO2 (!) 87%    GEN: sleeping with mouth open  RESP: light snoring, equal chest rise and fall   A/P: Afebrile. No change to plan. Planning for LP today.  - Orders reviewed. Labs for AM ordered, which was adjusted as needed.    Lyndee Hensen, DO 08/06/2021, 2:28 AM PGY-3, [facility] Night Resident  Please page [phone] with questions.

## 2021-08-06 NOTE — Progress Notes (Signed)
Neurology Team PROGRESS NOTE   INTERVAL HISTORY Patient seen and assessed at bedside. Patient consented to LP. LP was attempted but was unable to acquire CSF. Recommend to have fluoroscopy guided LP.    08/05/21 MRI with contrast concerning for enhancement along the peel surface of the cervicomedullary junction and pons suspicious for leptomeningeal spread of malignancy given findings on prior spine imaging. Small focus of enhancement in the left IAC.  Vitals:   08/05/21 2359 08/06/21 0400 08/06/21 0900 08/06/21 1155  BP: 103/65  113/76 123/71  Pulse: 85  87 75  Resp: 19  15 (!) 24  Temp: 97.6 F (36.4 C) 97.7 F (36.5 C) 97.6 F (36.4 C) 98.1 F (36.7 C)  TempSrc: Axillary Axillary Oral Axillary  SpO2: (!) 87%  94% 93%   CBC:  Recent Labs  Lab 08/01/21 0616 08/06/21 0059  WBC 12.5* 11.3*  HGB 14.9 13.5  HCT 44.1 38.7*  MCV 84.5 82.9  PLT 283 644    Basic Metabolic Panel:  Recent Labs  Lab 08/05/21 0949 08/06/21 0059  NA 128* 127*  K 3.6 3.8  CL 91* 91*  CO2 27 25  GLUCOSE 134* 120*  BUN 10 10  CREATININE 0.69 0.62  CALCIUM 8.9 8.6*    Lipid Panel:  Recent Labs  Lab 07/31/21 1247  CHOL 118  TRIG 45  HDL 60  CHOLHDL 2.0  VLDL 9  LDLCALC 49    HgbA1c:  Recent Labs  Lab 07/31/21 0056  HGBA1C 6.2*    Urine Drug Screen:  Recent Labs  Lab 07/31/21 1020  LABOPIA POSITIVE*  COCAINSCRNUR NONE DETECTED  LABBENZ NONE DETECTED  AMPHETMU NONE DETECTED  THCU NONE DETECTED  LABBARB NONE DETECTED     Alcohol Level No results for input(s): ETH in the last 168 hours.  IMAGING past 24 hours No results found.  PHYSICAL EXAM  Temp:  [97.5 F (36.4 C)-98.1 F (36.7 C)] 98.1 F (36.7 C) (02/02 1155) Pulse Rate:  [75-87] 75 (02/02 1155) Resp:  [15-24] 24 (02/02 1155) BP: (103-142)/(65-86) 123/71 (02/02 1155) SpO2:  [87 %-100 %] 93 % (02/02 1155)  General - Well nourished, well developed pleasant elderly male, in no apparent  distress.  Cardiovascular - Tachycardic (afib).  Mental Status -  Level of arousal and orientation to time, place, and person were intact. No aphasia. Speech is more clear today.  Cranial Nerves II - XII - II - Visual field intact OU. III, IV, VI - Extraocular movements intact. VII - Facial movement intact bilaterally. VIII - Hearing & vestibular intact bilaterally.  XII - Tongue protrusion intact.  Motor Strength - LLE significantly weaker than right 2/5 proximally and 0/5 distally.  LUE/RLE/RUE 5/5  Motor Tone - Muscle tone was assessed at the neck and appendages and was normal. Reflexes - The patients reflexes were symmetrical in all extremities and he had no pathological reflexes. Sensory - Reports difference in sensation to light touch in LLE from hip down Coordination - The patient had normal movements in the hands and feet with no ataxia or dysmetria.  Tremor was absent. Gait and Station - deferred.  ASSESSMENT/PLAN Ronald Sheppard is a 81 y.o. male with history of MI w/ ICD, CAD s/p stent, cerebellar stroke, HTN, HLD, PAF noncompliant with eliquis due to cost, BPH presenting with several days of left sided weakness. Lumbar and Thoracic spine MRI concerning for malignancy vs inflammation vs infection.   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 No occlusion or hemodynamically significant stenosis in the neck.   2D Echo EF 45-50% LDL 49 HgbA1c 6.2 VTE prophylaxis - Eliquis No antithrombotic prior to admission, now on Eliquis 5mg  bid. Therapy recommendations: SNF Disposition:  pending Lumbar MRI concerning for abnormal signal enhancement in the conus and cauda equina as well as L5-s1, L3-L4, L4-5, L2-L3 foraminal stenosis.  Thoracic MRI also concerning for scattered abnormal T2 signal from T3 to conus tip.  MRI brain shows no evidence of acute intracranial abnormalities suggesting leg weakness likely related to spinal findings and not due to an acute infarct in the brain. CT Lung, Abdomen and Pelvis: There is no evidence of focal pulmonary consolidation or discrete lung nodules. No significant lymphadenopathy is seen in the chest, abdomen and pelvis. No abnormalities noted on liver or adrenal enlargement. Large prostate concerning for BPH or neoplasm.  08/05/21 MRI with contrast concerning for enhancement along the peel surface of the cervicomedullary junction and pons suspicious for leptomeningeal spread of malignancy given findings on prior spine imaging. Small focus of enhancement in the left IAC. 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 After hours, contact General Neurology

## 2021-08-06 NOTE — Procedures (Signed)
LUMBAR PUNCTURE (SPINAL TAP) PROCEDURE NOTE  Indication: Concern for metastatic vs inflammatory vs infectious process in spine   Proceduralists: Lynnae Sandhoff, MD, France Ravens, MD, Myra Rude, NP   Risks of the procedure were dicussed with the patient including post-LP headache, bleeding, infection, weakness/numbness of legs(radiculopathy), death.    Consent obtained from: patient   Procedure Note The patient was prepped and draped, and using sterile technique a 20 gauge quinke spinal needle was attempted to be inserted into the L4-5 space.    We were unable to obtain any CSF.  Patient tolerated the procedure well and blood loss was minimal. Recommend DG fluoro LP.   France Ravens, MD PGY1 Resident

## 2021-08-06 NOTE — Progress Notes (Signed)
Family Medicine Teaching Service Daily Progress Note Intern Pager: [phone]  Patient name: Ronald Sheppard Medical record number: [id number] Date of birth: [birth year] Age: 81 y.o. Gender: male  Primary Care Provider: Raina Mina., MD Consultants: Neuro, urology  Code Status: Full   Pt Overview and Major Events to Date:  07/30/21 - Patient admitted 08/01/21 - Foley exchanged  Assessment and Plan:  Ronald Sheppard is a 81 year old male presenting after fall now with concern for malignancy.  Past medical history significant for MI with pacemaker and defibrillator CAD s/p stent, cerebellar stroke, HTN, HLD, BPH   Fall  concern for malignancy versus cauda equina syndrome versus infectious/inflammatory etiology  MRI of the brain with contrast showed enhancement along the pedal surface of the cervical medullary junction and pons suspicious for leptomeningeal spread of malignancy.  Neurology consulted and we are going to continue with lumbar puncture today to evaluate cerebral spinal fluid, holding home Eliquis and aspirin in anticipation of this.  Follow-up on lumbar puncture results - LP - Neurology following appreciate recommendations - PT/OT evaluate and treat --May need palliative consult  Urinary retention  lesion on the glans  elevated PSA Continue with indwelling Foley per urology recommendations, urine output over the last 24 hours is 950 mL.  Yesterday nursing mentioned a concern for an abscess given presence of pus, urology reported to not continue with a scrotal ultrasound and did not feel the need to continue with ceftriaxone.  We will continue with ceftriaxone given previous culture showing E. coli and strep anginosis and will complete 7 total days (2/7). 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  This is likely  secondary to spinal findings and possible malignancy. - Fluid restriction 1200 cc daily  -Salt tablets  -Monitor BMP  -LP today   Hypertension Long-term Target blood pressure range 308-6 50 systolic per neurology for stenosis in posterior circulation.  Blood pressure today 113/60s-70 - Cardizem 120 daily - Continue to monitor  All other conditions chronic and stable Hyperlipidemia, history of cerebellar stroke, history of MI, CAD with stents, MI in 2020 with stent in LAD and CX with defibrillator placed, Hx of PAF  FEN/GI: Fluid restriction to 1200 mL PPx: SCDs Dispo:Next few days, determine lumbar puncture   Subjective:  Pt reports he is doing well and is unchanged as far as pain is concerned with some pain in his bottom.   Objective: Temp:  [97.5 F (36.4 C)-97.8 F (36.6 C)] 97.6 F (36.4 C) (02/02 0900) Pulse Rate:  [81-87] 87 (02/02 0900) Resp:  [15-20] 15 (02/02 0900) BP: (103-151)/(65-86) 113/76 (02/02 0900) SpO2:  [87 %-100 %] 94 % (02/02 0900) General: Alert and oriented in no apparent distress Heart: Regular rate and rhythm with no murmurs appreciated Lungs: CTA bilaterally, no wheezing Abdomen: Bowel sounds present, no abdominal pain Skin: Warm and dry GU: Foley catheter in place with lesion on glans noted no obvious abnormal swelling or erythema  Extremities: No lower extremity edema   Laboratory: Recent Labs  Lab 07/31/21 0056 08/01/21 0616 08/06/21 0059  WBC 15.7* 12.5* 11.3*  HGB 13.8 14.9 13.5  HCT 40.7 44.1 38.7*  PLT 338 283 287   Recent Labs  Lab 08/04/21 0227 08/05/21 0949 08/06/21 0059  NA 127* 128* 127*  K 3.8 3.6 3.8  CL 89* 91* 91*  CO2 25 27 25   BUN 13 10 10   CREATININE 0.71 0.69 0.62  CALCIUM 8.9 8.9 8.6*  GLUCOSE 138* 134* 120*        Erskine Emery, MD 08/06/2021, 9:10 AM PGY-1, [place] Intern pager: [phone], text pages welcome

## 2021-08-06 NOTE — Progress Notes (Signed)
Family Medicine Teaching Service Daily Progress Note Intern Pager: [phone]  Patient name: Ronald Sheppard Medical record number: [id number] Date of birth: Jun 03, 1941 Age: 81 y.o. Gender: male  Primary Care Provider: Raina Mina., MD Consultants: Neuro, Urology Code Status: Full  Pt Overview and Major Events to Date:  1/26 - Admitted 1/28 - Foley exchanged  Assessment and Plan: Ronald Sheppard  is a 81 year old male presenting after fall now with concern for malignancy.  Past medical history significant for MI with pacemaker and defibrillator CAD s/p stent, cerebellar stroke, HTN, HLD, BPH   Isolated LLE weakness - lumbosacral radiculopathy, d/t conus medullaris lesion. Unclear etiology LP to be completed today.  - Neurology following, appreciate recs - PT/OT eval and treat - Consider palliative consult - Home Eliquis and ASA held for LP, will evaluate for timing to resume  Urinary retention   Glans lesion   Elevated PSA UOP in the last 24h 1500 mL.  Evaluated by urology, may need to consider voiding trial based on urology recommendations once improved neurologic status - Urology consulted, appreciate recs - Continue Foley catheter - Continue to monitor  - CTX (Day 3/7)  Hyponatremia d/t SIADH Remains somewhat stable at 126. - Fluid restriction 1200cc daily - Salt tablets - Monitor BMP  HTN BP largely normotensive, on the lower side. Long-term BP goal 130-150 given severe posterior circulation stenosis.  - Closely monitor  HLD LDL currently at goal (<70) - Continue atorvastatin 40mg    FEN/GI: Fluid restriction 1210mL PPx: SCDs Dispo:Pending PT recommendations  in 3 or more days. Barriers include continued medical work-up.   Subjective:  Patient reports that he is feeling okay this morning.  He does not have any acute complaints.  Objective: Temp:  [97.6 F (36.4 C)-99.3 F (37.4 C)] 99.3 F (37.4 C) (02/03 0426) Pulse Rate:  [69-84] 69 (02/03 0426) Resp:   [18-24] 20 (02/03 0426) BP: (107-142)/(71-86) 116/81 (02/03 0426) SpO2:  [93 %-96 %] 96 % (02/03 0426) Physical Exam: General: NAD, supine in bed, elderly male Cardiovascular: RRR, no appreciated Respiratory: Clear to auscultation bilaterally, no increased work of breathing Abdomen: Soft, nontender, nondistended Extremities: No lower extremity edema  Laboratory: Recent Labs  Lab 08/01/21 0616 08/06/21 0059 08/07/21 0138  WBC 12.5* 11.3* 10.9*  HGB 14.9 13.5 13.3  HCT 44.1 38.7* 38.3*  PLT 283 287 258   Recent Labs  Lab 08/05/21 0949 08/06/21 0059 08/07/21 0138  NA 128* 127* 126*  K 3.6 3.8 3.8  CL 91* 91* 91*  CO2 27 25 24   BUN 10 10 10   CREATININE 0.69 0.62 0.65  CALCIUM 8.9 8.6* 8.5*  GLUCOSE 134* 120* 132*    Imaging/Diagnostic Tests: No results found.   Rise Patience, DO 08/07/2021, 9:18 AM PGY-2, [place] Intern pager: [phone], text pages welcome

## 2021-08-06 NOTE — Progress Notes (Signed)
FPTS Brief Progress Note  S: Patient had no acute issues or concerns tonight.  O: BP 126/72 (BP Location: Right Arm)    Pulse 83    Temp 98.1 F (36.7 C) (Axillary)    Resp (!) 24    SpO2 95%    GEN: Resting comfortably in bed, speaking on phone to friend Resp: Breathing comfortably on room air  A/P: Fall  concern for malignancy versus cauda equina syndrome versus infectious/inflammatory etiology  -Plan for DG fluoro tomorrow - Orders reviewed. Labs for AM ordered, which was adjusted as needed.   Urinary retention  lesion on the glans  elevated PSA Foley in place   Gerrit Heck, MD 08/06/2021, 9:11 PM PGY-1, [facility] Family Medicine Night Resident  Please page [phone] with questions.

## 2021-08-07 ENCOUNTER — Inpatient Hospital Stay (HOSPITAL_COMMUNITY): Payer: Medicare Other

## 2021-08-07 DIAGNOSIS — Z950 Presence of cardiac pacemaker: Secondary | ICD-10-CM | POA: Diagnosis not present

## 2021-08-07 DIAGNOSIS — M25559 Pain in unspecified hip: Secondary | ICD-10-CM

## 2021-08-07 DIAGNOSIS — G834 Cauda equina syndrome: Secondary | ICD-10-CM | POA: Diagnosis not present

## 2021-08-07 DIAGNOSIS — R531 Weakness: Secondary | ICD-10-CM | POA: Diagnosis not present

## 2021-08-07 LAB — CSF CELL COUNT WITH DIFFERENTIAL
Lymphs, CSF: 96 % — ABNORMAL HIGH (ref 40–80)
RBC Count, CSF: 18475 /mm3 — ABNORMAL HIGH
Segmented Neutrophils-CSF: 4 % (ref 0–6)
Tube #: 3
WBC, CSF: 235 /mm3 (ref 0–5)

## 2021-08-07 LAB — BASIC METABOLIC PANEL
Anion gap: 11 (ref 5–15)
BUN: 10 mg/dL (ref 8–23)
CO2: 24 mmol/L (ref 22–32)
Calcium: 8.5 mg/dL — ABNORMAL LOW (ref 8.9–10.3)
Chloride: 91 mmol/L — ABNORMAL LOW (ref 98–111)
Creatinine, Ser: 0.65 mg/dL (ref 0.61–1.24)
GFR, Estimated: >60 mL/min (ref >60)
Glucose, Bld: 132 mg/dL — ABNORMAL HIGH (ref 70–99)
Potassium: 3.8 mmol/L (ref 3.5–5.1)
Sodium: 126 mmol/L — ABNORMAL LOW (ref 135–145)

## 2021-08-07 LAB — CBC
HCT: 38.3 % — ABNORMAL LOW (ref 39.0–52.0)
Hemoglobin: 13.3 g/dL (ref 13.0–17.0)
MCH: 28.9 pg (ref 26.0–34.0)
MCHC: 34.7 g/dL (ref 30.0–36.0)
MCV: 83.3 fL (ref 80.0–100.0)
Platelets: 258 10*3/uL (ref 150–400)
RBC: 4.6 MIL/uL (ref 4.22–5.81)
RDW: 13 % (ref 11.5–15.5)
WBC: 10.9 10*3/uL — ABNORMAL HIGH (ref 4.0–10.5)
nRBC: 0 % (ref 0.0–0.2)

## 2021-08-07 LAB — PROTEIN, TOTAL: Total Protein: 6.4 g/dL — ABNORMAL LOW (ref 6.5–8.1)

## 2021-08-07 LAB — PROTEIN, CSF: Total  Protein, CSF: >600 mg/dL — ABNORMAL HIGH (ref 15–45)

## 2021-08-07 LAB — GLUCOSE, CSF: Glucose, CSF: 58 mg/dL (ref 40–70)

## 2021-08-07 IMAGING — RF DG SPINAL PUNCT LUMBAR DIAG WITH FL CT GUIDANCE
4 series · 8 of 8 positions shown · non-contrast
Comparison: None

CLINICAL DATA: Left leg weakness and numbness, concern for
metastatic disease

EXAM:
DIAGNOSTIC LUMBAR PUNCTURE UNDER FLUOROSCOPIC GUIDANCE

[Series 1: cp_standard · 0.26mm/px · 1 of 1 slices shown (1 of 4)]
[im 1/1]
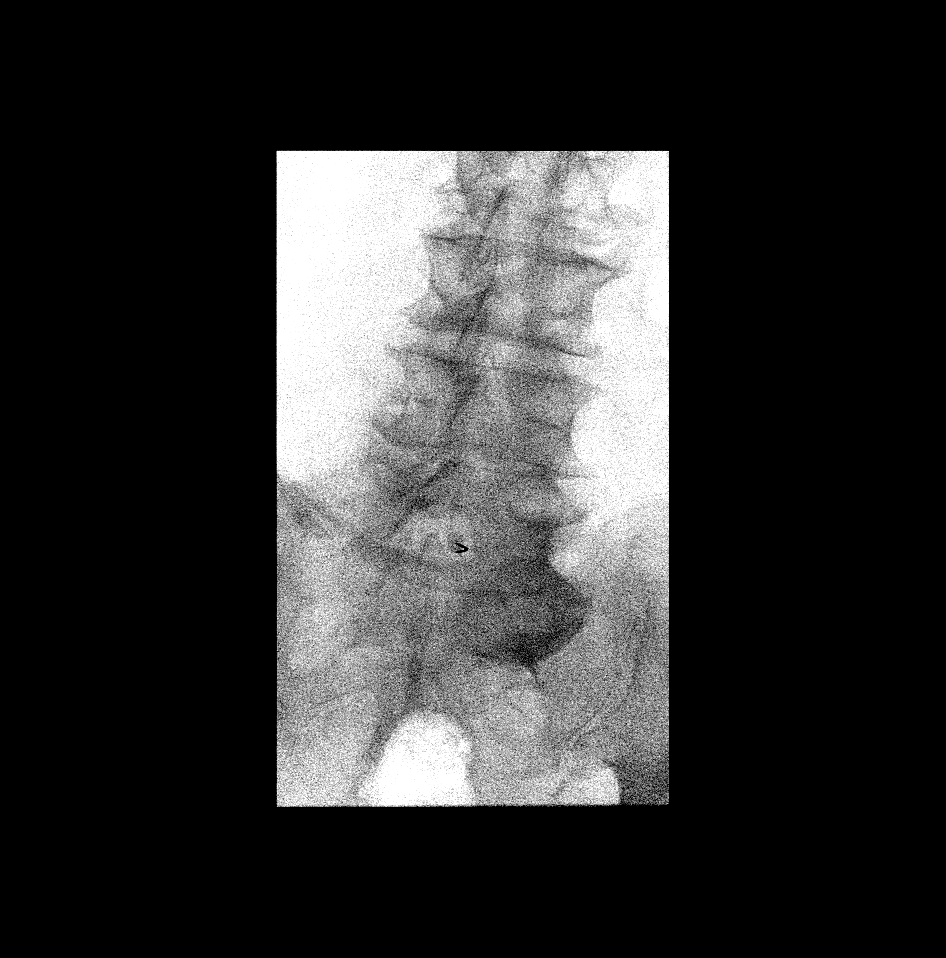

[Series 2: cp_standard · 0.53mm/px · 3 of 4 frames shown (2 of 4)]
[frame 1/4]
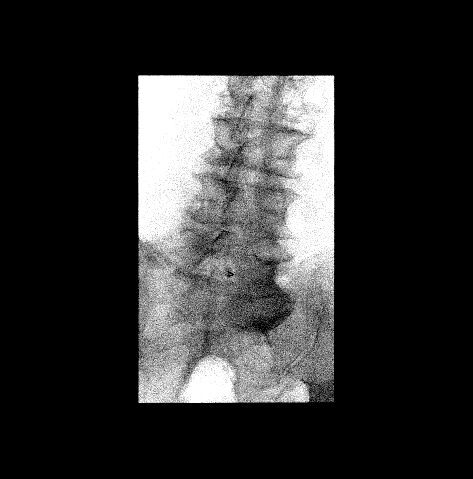
[frame 3/4]
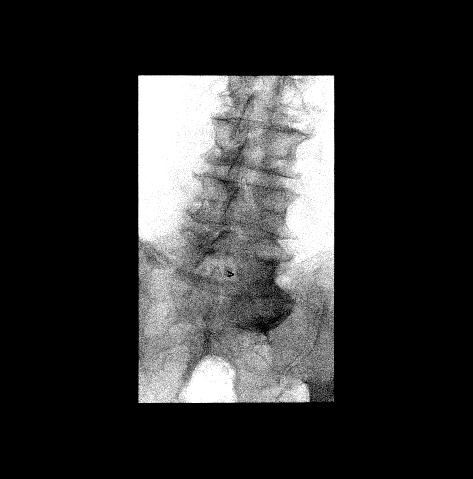
[frame 4/4]
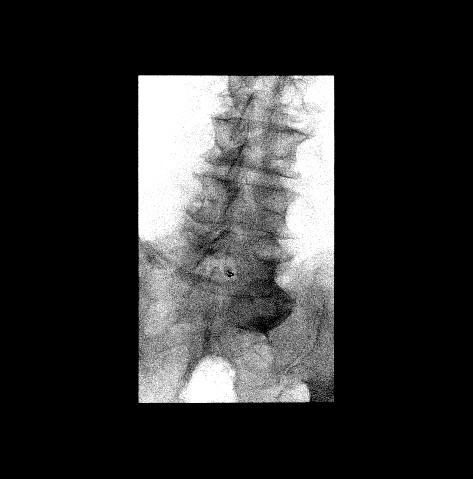

[Series 3: cp_standard · 0.25mm/px · 1 of 1 slices shown (3 of 4)]
[im 1/1]
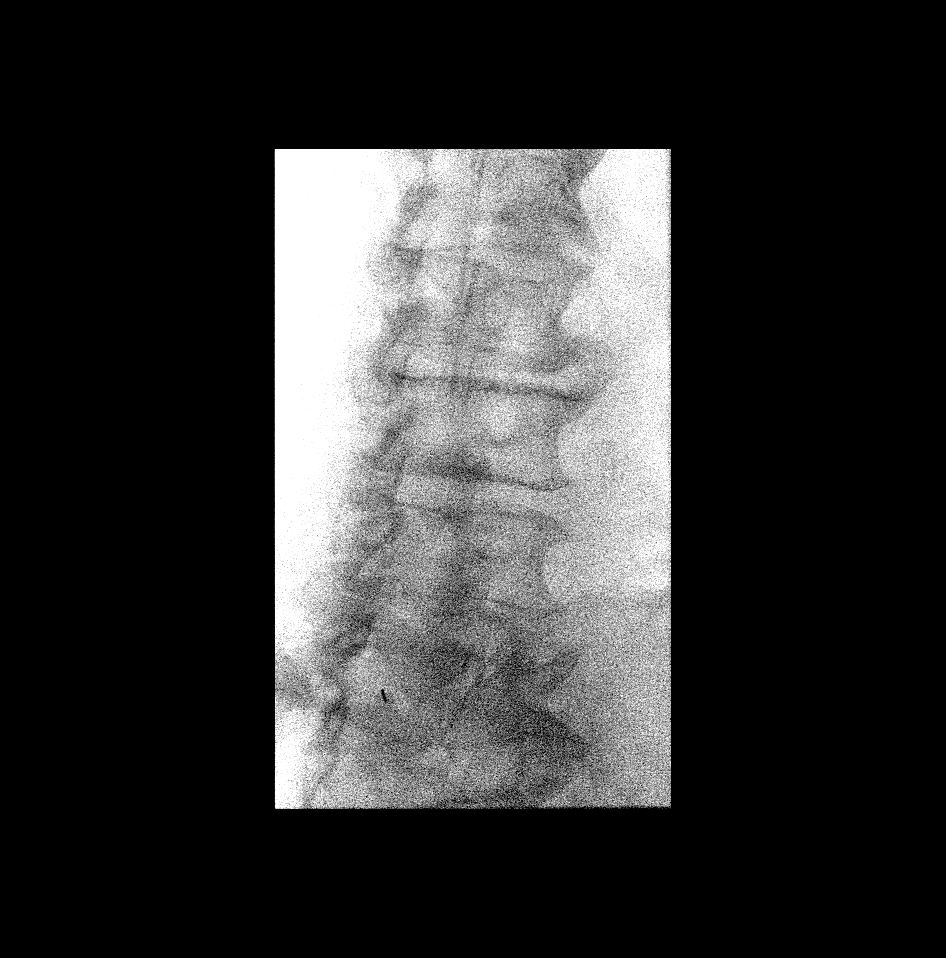

[Series 4: cp_standard · 0.51mm/px · 3 of 4 frames shown (4 of 4)]
[frame 1/4]
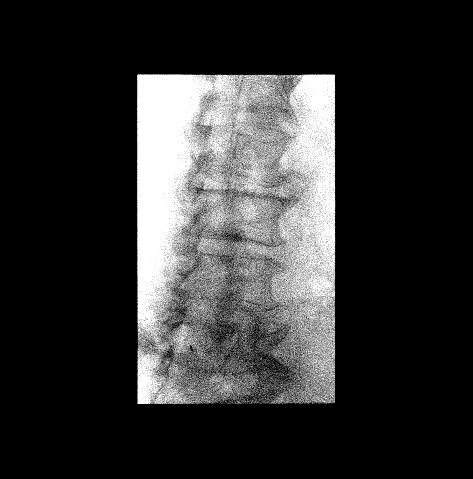
[frame 3/4]
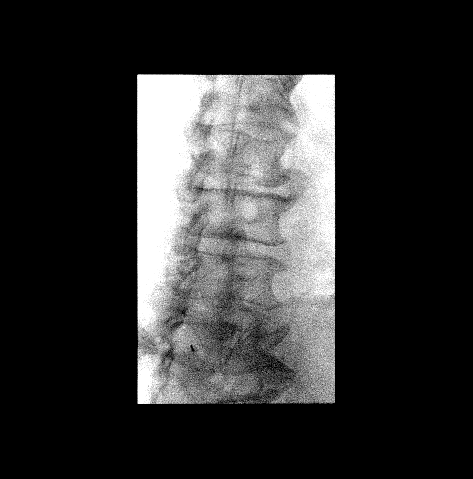
[frame 4/4]
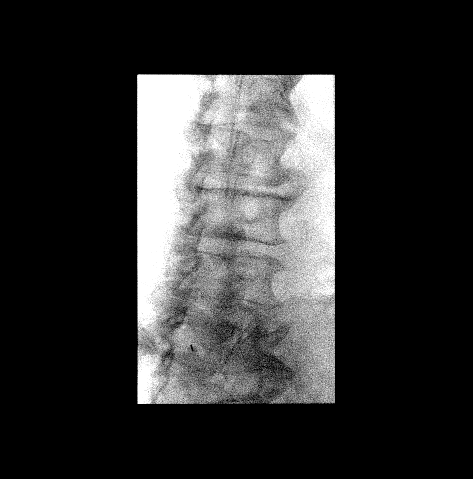

[8 of 8 positions shown; findings below may reference images not displayed]

FLUOROSCOPY:
Radiation Exposure Index (if provided by the fluoroscopic device):
36.7 mGy

PROCEDURE:
Informed consent was obtained from the patient prior to the
procedure, including potential complications of headache, allergy,
and pain. With the patient prone, the lower back was prepped with
Betadine. 1% Lidocaine was used for local anesthesia. Lumbar
puncture was performed at the L5-S1 level using a 20 gauge needle
with return of blood tinged CSF. 10 ml of CSF were obtained for
laboratory studies. The procedure was complicated by patient
inability to hold still. Thus, multiple attempts were made to access
the subarachnoid space. The patient tolerated the procedure well.
IMPRESSION: Successful fluoroscopic guided lumbar puncture as described above.

Read and performed by: TANNAM, PA-C

## 2021-08-07 MED ORDER — APIXABAN 5 MG PO TABS
5.0000 mg | ORAL_TABLET | Freq: Two times a day (BID) | ORAL | Status: DC
  Administered 2021-08-07 – 2021-08-13 (×12): 5 mg via ORAL
  Filled 2021-08-07 (×12): qty 1

## 2021-08-07 MED ORDER — LIDOCAINE HCL (PF) 1 % IJ SOLN
5.0000 mL | Freq: Once | INTRAMUSCULAR | Status: AC
  Administered 2021-08-07: 5 mL via INTRADERMAL
  Filled 2021-08-07: qty 5

## 2021-08-07 NOTE — Procedures (Signed)
PROCEDURE SUMMARY:  Successful Lumbar Puncture from L5-S1.  Yielded 10cc of blood tinged fluid.  No immediate complications.  Pt tolerated well.   Specimen was sent for labs.  EBL < 1mL  Laurice Iglesia PA-C 08/07/2021 12:11 PM

## 2021-08-07 NOTE — Progress Notes (Signed)
Patient ID: Melody Savidge, male   DOB: 04-09-1941, 81 y.o.   MRN: [id number]    Subjective: Patient is n.p.o. as he is proceeding with spinal tap today.  Upon my evaluation, urethral erosion remained stable.  Scrotal exam is stable.  His catheter was on tension.  I removed the StatLock and instead used a leg securement strap to secure the catheter by the drainage tubing leaving it on no tension at all.  I also provided nursing teaching.  Objective: Vital signs in last 24 hours: Temp:  [97.6 F (36.4 C)-99.3 F (37.4 C)] 99.3 F (37.4 C) (02/03 0426) Pulse Rate:  [69-87] 69 (02/03 0426) Resp:  [15-24] 20 (02/03 0426) BP: (107-142)/(71-86) 116/81 (02/03 0426) SpO2:  [93 %-96 %] 96 % (02/03 0426)  Intake/Output from previous day: 02/02 0701 - 02/03 0700 In: 458 [P.O.:358; IV Piggyback:100] Out: 1500 [Urine:1500] Intake/Output this shift: No intake/output data recorded.  Physical Exam:  General: Alert and oriented GU: Scrotal exam is normal. No erythema, testes are palpably normal.  No skin lesions.  Penis demonstrates stable urethral erosion compared to evaluation over the weekend. No evidence of genital infection.  I secured the penis using a leg strap device in a manner with no tension and provided nursing with teaching.  Lab Results: Recent Labs    08/06/21 0059 08/07/21 0138  HGB 13.5 13.3  HCT 38.7* 38.3*   BMET Recent Labs    08/06/21 0059 08/07/21 0138  NA 127* 126*  K 3.8 3.8  CL 91* 91*  CO2 25 24  GLUCOSE 120* 132*  BUN 10 10  CREATININE 0.62 0.65  CALCIUM 8.6* 8.5*    CBC Latest Ref Rng & Units 08/07/2021 08/06/2021 08/01/2021  WBC 4.0 - 10.5 K/uL 10.9(H) 11.3(H) 12.5(H)  Hemoglobin 13.0 - 17.0 g/dL 13.3 13.5 14.9  Hematocrit 39.0 - 52.0 % 38.3(L) 38.7(L) 44.1  Platelets 150 - 400 K/uL 258 287 283     Studies/Results: MR BRAIN W CONTRAST  Result Date: 08/05/2021 CLINICAL DATA:  Left leg weakness and numbness, concern for malignancy EXAM: MRI HEAD WITH  CONTRAST TECHNIQUE: Multiplanar, multiecho pulse sequences of the brain and surrounding structures were obtained with intravenous contrast. CONTRAST:  82mL GADAVIST GADOBUTROL 1 MMOL/ML IV SOLN COMPARISON:  Brain MRI 08/03/2021 FINDINGS: Brain: Parenchymal volume is stable. The ventricles are stable in size. Remote infarcts are again seen in the bilateral cerebellar hemispheres. 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 There is a small focus of enhancement in the left IAC (5-11). There is no other abnormal intracranial enhancement. There is no mass effect or midline shift. Vascular: The vessels are grossly unremarkable on the provided sequences. Skull and upper cervical spine: Normal marrow signal. Sinuses/Orbits: Grossly unremarkable on the provided sequences. Other: None. IMPRESSION: 1. Enhancement along the peel surface of the cervicomedullary junction and pons suspicious for leptomeningeal spread of malignancy given findings on prior spine imaging. Infectious or inflammatory etiology is possible but considered less likely. Recommend correlation with lumbar puncture. 2. Small focus of enhancement in the left IAC may reflect leptomeningeal disease on the 7/8 cranial nerve versus a small schwannoma. Prior contrast enhanced imaging of the brain, if available, would be helpful for comparison. 3. No other evidence of intracranial metastatic disease. Electronically Signed   By: Valetta Mole M.D.   On: 08/05/2021 14:32    Assessment/Plan: 1) Urinary retention:  May be able to consider voiding trial during hospitalization although  will have to see if he shows clinical improvement in his neurologic status.  If not, may be best to continue catheter drainage and consider outpatient urodynamic evaluation.  He does have BPH based on CT imaging yesterday.  2) Urethral erosion:  Continue to maintain catheter off tension at urethral  meatus to prevent further erosion.  I utilized a leg securement strap to secure the catheter by the drainage tubing leaving it on no tension at all.  I also provided nursing teaching.There is no evidence of genital infection.  He does not need a scrotal ultrasound (exam is normal) and does not need antibiotics from a urologic perspective.    LOS: 6 days   Marguerette Sheller Abu-Salha 08/07/2021, 7:58 AM

## 2021-08-07 NOTE — Progress Notes (Signed)
FPTS Interim Progress Note  Spoke to daughter, Erline Levine, to provide update on fluoroscopic LP today. Awaiting results. Daughter is open to assistance of palliative team but patient does not want to start this conversation until LP results return. We will plan for palliative care consult after LP results return and will have goals of care discussion with daughter at her and patient's request.   Donney Dice, DO 08/07/2021, 2:54 PM PGY-2, [place] Medicine Service pager [phone]

## 2021-08-08 DIAGNOSIS — C801 Malignant (primary) neoplasm, unspecified: Secondary | ICD-10-CM

## 2021-08-08 DIAGNOSIS — G834 Cauda equina syndrome: Secondary | ICD-10-CM | POA: Diagnosis not present

## 2021-08-08 DIAGNOSIS — Z950 Presence of cardiac pacemaker: Secondary | ICD-10-CM | POA: Diagnosis not present

## 2021-08-08 DIAGNOSIS — R531 Weakness: Secondary | ICD-10-CM | POA: Diagnosis not present

## 2021-08-08 LAB — CBC
HCT: 38.2 % — ABNORMAL LOW (ref 39.0–52.0)
Hemoglobin: 12.7 g/dL — ABNORMAL LOW (ref 13.0–17.0)
MCH: 27.9 pg (ref 26.0–34.0)
MCHC: 33.2 g/dL (ref 30.0–36.0)
MCV: 84 fL (ref 80.0–100.0)
Platelets: 272 10*3/uL (ref 150–400)
RBC: 4.55 MIL/uL (ref 4.22–5.81)
RDW: 13 % (ref 11.5–15.5)
WBC: 10.7 10*3/uL — ABNORMAL HIGH (ref 4.0–10.5)
nRBC: 0 % (ref 0.0–0.2)

## 2021-08-08 LAB — BASIC METABOLIC PANEL
Anion gap: 9 (ref 5–15)
BUN: 11 mg/dL (ref 8–23)
CO2: 25 mmol/L (ref 22–32)
Calcium: 8.6 mg/dL — ABNORMAL LOW (ref 8.9–10.3)
Chloride: 93 mmol/L — ABNORMAL LOW (ref 98–111)
Creatinine, Ser: 0.71 mg/dL (ref 0.61–1.24)
GFR, Estimated: >60 mL/min (ref >60)
Glucose, Bld: 136 mg/dL — ABNORMAL HIGH (ref 70–99)
Potassium: 3.8 mmol/L (ref 3.5–5.1)
Sodium: 127 mmol/L — ABNORMAL LOW (ref 135–145)

## 2021-08-08 LAB — MAGNESIUM: Magnesium: 2 mg/dL (ref 1.7–2.4)

## 2021-08-08 MED ORDER — ASPIRIN EC 81 MG PO TBEC
81.0000 mg | DELAYED_RELEASE_TABLET | Freq: Every day | ORAL | Status: DC
  Administered 2021-08-08 – 2021-08-13 (×6): 81 mg via ORAL
  Filled 2021-08-08 (×6): qty 1

## 2021-08-08 NOTE — Plan of Care (Addendum)
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   Fluoro guided LP was traumatic and difficult to interpret however, CSF RBC 18,475 and estimated CSF protein 600 corrected CSF protein 580mg /dL which remains elevated.   Cytology may be pending.  - Recommend oncology consultation or palliative care consult. - Neurology will remain available, please call for questions.   Electronically signed by:  Lynnae Sandhoff, MD Page: [phone] 08/08/2021, 11:10 AM

## 2021-08-08 NOTE — Progress Notes (Addendum)
Family Medicine Teaching Service Daily Progress Note Intern Pager: [phone]  Patient name: Ronald Sheppard Medical record number: [id number] Date of birth: 1940/12/23 Age: 81 y.o. Gender: male  Primary Care Provider: Raina Mina., MD Consultants: Neurology, urology Code Status: Full  Pt Overview and Major Events to Date:  1/26 - Admitted 1/28 - Foley exchanged  Assessment and Plan:  Ronald Sheppard is a 81 year old male presenting after fall now with concern for malignancy.  Past medical history significant for MI with pacemaker and defibrillator, CAD status post stent, cerebellar stroke, hypertension, hyperlipidemia, BPH, a fib on eliquis   Isolated left lower extremity weakness with lumbosacral radiculopathy  conus medullaris lesion, unclear etiology LP performed yesterday seems to be a traumatic tap with amount of red blood cells (CSF: Gluc 58, Prot >600, Red color, bloody, RBC 18475, WBC 235, Lymphocytes 96).  However, white blood cells were elevated with elevated lymphocytes, elevated total protein, normal amount of glucose (could be related to malignancy given pleocytosis and high protein count).  Cytology is pending which should be able to differentiate possible malignant cells. Await restarting ASA tomorrow? (wait 48-72 hours after traumatic tap due to risk of bleeding). Hgb 12.7 with nml MCV, secondary to procedure.  - Await cytology - Neurology recommendations appreciated - PT and OT evaluate and treat - Consider palliative consult after cytology results return - Likely ASA tomorrow -- Home Eliquis continued last night   Urinary retention  glans lesion  elevated PSA Urine output 1.3 L over the last 24 hours.  Urology will consider voiding trial based on neurologic status, appreciate recommendations. - Urology consulted, appreciate recs - Continue Foley catheter for now - Monitor lesion with regular exams - Ceftriaxone day 4/7  Hyponatremia secondary to SIADH Still remains  stable 126-->127 - Fluid restriction 1200 cc daily - Salt tablets - Monitor BMP  Hypertension Blood pressure largely normotensive (110s-120s/80s) with a long-term goal 130-1 50 given severe particular circulation stenosis.  -Continue to monitor  HLD LDL currently at goal - Continue atorvastatin 40  PAF Stable on eliquis   FEN/GI: Fluid restriction of 1200 mL PPx: SCDs Dispo: Pending PT recommendations and lumbar puncture cytology results.  Subjective:  Pt is doing well this AM and denies any pain, is requesting more water. He reports that the pain in his back was painful last night. No HAs at this moment.   Objective: Temp:  [97.9 F (36.6 C)-98.9 F (37.2 C)] 97.9 F (36.6 C) (02/04 0419) Pulse Rate:  [69-87] 87 (02/04 0419) Resp:  [17-20] 17 (02/04 0419) BP: (113-152)/(83-88) 114/87 (02/04 0419) SpO2:  [92 %-98 %] 96 % (02/04 0419) Physical Exam: General: NAD, drowsy in bed  Cardiovascular: RRR, no m/r/g, outline of pacemaker present Respiratory: CTAB, no crackles or stridor Abdomen: Nontender, nondistended, soft  Extremities: No edema, palpable DP pulses bilaterally  GU: Foley catheter in place  Laboratory: Recent Labs  Lab 08/06/21 0059 08/07/21 0138 08/08/21 0158  WBC 11.3* 10.9* 10.7*  HGB 13.5 13.3 12.7*  HCT 38.7* 38.3* 38.2*  PLT 287 258 272   Recent Labs  Lab 08/06/21 0059 08/07/21 0138 08/07/21 1903 08/08/21 0158  NA 127* 126*  --  127*  K 3.8 3.8  --  3.8  CL 91* 91*  --  93*  CO2 25 24  --  25  BUN 10 10  --  11  CREATININE 0.62 0.65  --  0.71  CALCIUM 8.6* 8.5*  --  8.6*  PROT  --   --  6.4*  --   GLUCOSE 120* 132*  --  136*     Imaging/Diagnostic Tests:  LP: CSF: Gluc 58, Prot >600, Red color, bloody, RBC 18475, WBC 235, Lymphocyte 96  Erskine Emery, MD 08/08/2021, 7:02 AM PGY-1, [place] Intern pager: [phone], text pages welcome

## 2021-08-08 NOTE — Plan of Care (Signed)
  Problem: Clinical Measurements: Goal: Cardiovascular complication will be avoided Outcome: Progressing   Problem: Pain Managment: Goal: General experience of comfort will improve Outcome: Progressing   

## 2021-08-08 NOTE — Progress Notes (Signed)
FPTS Brief Progress Note  S: Patient asleep, resting comfortably did not awaken   O: BP 122/85 (BP Location: Right Arm)    Pulse 81    Temp 98 F (36.7 C) (Oral)    Resp 20    SpO2 92%    GEN: Comfortable, NAD  A/P: Plan per day team note - Orders reviewed. Labs for AM ordered, which was adjusted as needed.  Gerrit Heck, MD 08/08/2021, 1:24 AM PGY-1, [facility] Family Medicine Night Resident  Please page [phone] with questions.

## 2021-08-09 DIAGNOSIS — R531 Weakness: Secondary | ICD-10-CM | POA: Diagnosis not present

## 2021-08-09 DIAGNOSIS — C801 Malignant (primary) neoplasm, unspecified: Secondary | ICD-10-CM | POA: Diagnosis not present

## 2021-08-09 DIAGNOSIS — G834 Cauda equina syndrome: Secondary | ICD-10-CM | POA: Diagnosis not present

## 2021-08-09 DIAGNOSIS — Z950 Presence of cardiac pacemaker: Secondary | ICD-10-CM | POA: Diagnosis not present

## 2021-08-09 DIAGNOSIS — L89300 Pressure ulcer of unspecified buttock, unstageable: Secondary | ICD-10-CM

## 2021-08-09 LAB — CBC
HCT: 38.2 % — ABNORMAL LOW (ref 39.0–52.0)
Hemoglobin: 13.1 g/dL (ref 13.0–17.0)
MCH: 28.8 pg (ref 26.0–34.0)
MCHC: 34.3 g/dL (ref 30.0–36.0)
MCV: 84 fL (ref 80.0–100.0)
Platelets: 251 10*3/uL (ref 150–400)
RBC: 4.55 MIL/uL (ref 4.22–5.81)
RDW: 13.2 % (ref 11.5–15.5)
WBC: 11.6 10*3/uL — ABNORMAL HIGH (ref 4.0–10.5)
nRBC: 0 % (ref 0.0–0.2)

## 2021-08-09 LAB — BASIC METABOLIC PANEL
Anion gap: 12 (ref 5–15)
BUN: 16 mg/dL (ref 8–23)
CO2: 23 mmol/L (ref 22–32)
Calcium: 8.6 mg/dL — ABNORMAL LOW (ref 8.9–10.3)
Chloride: 93 mmol/L — ABNORMAL LOW (ref 98–111)
Creatinine, Ser: 0.74 mg/dL (ref 0.61–1.24)
GFR, Estimated: >60 mL/min (ref >60)
Glucose, Bld: 124 mg/dL — ABNORMAL HIGH (ref 70–99)
Potassium: 3.9 mmol/L (ref 3.5–5.1)
Sodium: 128 mmol/L — ABNORMAL LOW (ref 135–145)

## 2021-08-09 MED ORDER — COLLAGENASE 250 UNIT/GM EX OINT
TOPICAL_OINTMENT | Freq: Every day | CUTANEOUS | Status: DC
  Filled 2021-08-09: qty 30

## 2021-08-09 MED ORDER — DAKINS (1/4 STRENGTH) 0.125 % EX SOLN
Freq: Two times a day (BID) | CUTANEOUS | Status: AC
  Filled 2021-08-09 (×2): qty 473

## 2021-08-09 NOTE — Consult Note (Addendum)
[facility] Nurse Consult Note: Reason for Consult:Unstageable wound to sacrum. Noted on admission as a Stage 2 pressure injury. Wound type: Pressure, shear Pressure Injury POA: Yes Measurement: 10cm x 9cm with 90% nonviable tissue, eschar obscuring true wound depth. Wound bed:As described above. Drainage (amount, consistency, odor) grey, malodorous on old dressing in a moderate amount Periwound: intact Dressing procedure/placement/frequency:A mattress replacement with low air loss feature is provided today; patient is wearing bilateral Prevalon Boots and the heels are intact. Topical wound care guidance is provided for the use of sodium hypochlorite solution (Dakin's solution) applied twice daily for 3 days. Following this intervention, twice daily collagenase (Santyl) ointment will be applied followed by a saline moistened gauze dressing, dry dressing, ABD pad and secured with tape.   Downstream, hydrotherapy may be considered for implementation at a three-time-a-week interval to expedite removal of nonviable tissue. This is performed in conjunction with Conservative Sharp Wound Debridement (CSWD) at bedside by PT/Rehab services.  Recommend Nutritional Consult with Registered Dietician as patient will require supplementation for nutrients required wound wound healing.  Also recommend consultation with General Surgery for evaluation and input on POC, also for consideration of surgical debridement. If you agree, please order the latter, I have consulted RD.  [facility] nursing team will follow, seeing every 7-10 days and will remain available to this patient, the nursing and medical teams.   Thanks, Maudie Flakes, MSN, RN, [place], Arther Abbott  Pager# [phone]

## 2021-08-09 NOTE — Progress Notes (Signed)
FPTS Brief Progress Note  S:Patient sleeping when I came to his room    O: BP 119/68 (BP Location: Right Arm)    Pulse 78    Temp 99.4 F (37.4 C) (Oral)    Resp 19    SpO2 93%   General: sleeping, NAD Resp: Normal WOB on RA   A/P:  Isolated left lower extremity weakness with lumbosacral radiculopathy  conus medullaris lesion Neurology following. 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 Labs for AM ordered, which was adjusted as needed.   Remainder of plan per day progress note  Shary Key, DO 08/09/2021, 12:39 AM PGY-2, [facility] Night Resident  Please page [phone] with questions.

## 2021-08-09 NOTE — Progress Notes (Signed)
Family Medicine Teaching Service Daily Progress Note Intern Pager: [phone]  Patient name: Ronald Sheppard Medical record number: [id number] Date of birth: [birth year] Age: 81 y.o. Gender: male  Primary Care Provider: Raina Mina., MD Consultants: Neurology, urology  Code Status: Full  Pt Overview and Major Events to Date:  Admitted: 1/26 Foley exchanged: 1/28 Fluoroscopic LP: 2/3  Assessment and Plan: Ronald Sheppard is a 81 year old male presenting after fall now with concern for malignancy.  Past medical history significant for MI with pacemaker and defibrillator, CAD status post stent, cerebellar stroke, hypertension, hyperlipidemia, BPH and a fib on eliquis.   Isolated left lower extremity weakness with lumbosacral radiculopathy  conus medullaris lesion, unclear etiology No new concerns today. 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 Fluoroscopic LP performed 2/3. Awaiting cytology results. Concern for malignancy given extensive neurological symptoms and ongoing workup, spoke to daughter previously who is open to palliative discussion but would like to wait on confirmatory results prior to this per patient request.  -neurology following, appreciate continued involvement and recommendations -awaiting CSF studies -consider palliative care consult once studies return for assistance with goals of care discussion -patient may benefit from oncology consult as well   Urinary retention  glans lesion  elevated PSA Most recent urinary output 725 mL over the past day. Urology following and may consider voiding trial later this week. Possible concern for neurogenic bladder. Foley in place.  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 Na 128, remains relatively stable over the past week.   -continue salt tablets -am BMP -fluid restriction 1200 mL  Hypertension Remains normotensive over the past 24 hours.  -monitor BP  HLD Chronic and stable. Most recent LDL 49, at goal.  -atorvastatin    PAF Eliquis restarted. HR well-controlled.   -monitor HR -continue eliquis -EKG as appropriate   FEN/GI: renal diet with fluid restriction of 1200 PPx: eliquis 5 mg bid  Dispo:Pending PT recommendations  and ongoing neurological workup    Subjective:  No acute overnight events reported. Patient is doing well and denies any concerns this morning. He is wanting to be more active today and plans to get out of bed.   Objective: Temp:  [97.5 F (36.4 C)-99.4 F (37.4 C)] 97.5 F (36.4 C) (02/05 0801) Pulse Rate:  [76-84] 77 (02/05 0801) Resp:  [19-23] 19 (02/05 0801) BP: (118-129)/(63-83) 128/83 (02/05 0801) SpO2:  [93 %-96 %] 95 % (02/05 0332) Physical Exam: General: Patient laying comfortably in bed, in no acute distress. Cardiovascular: RRR, no murmurs or gallops auscultated  Respiratory: CTAB, no wheezing or rales noted  Abdomen: soft, nontender, nondistended, presence of bowel sounds  Extremities: no obvious LE edema noted, distal pulses present bilaterally Neuro: AOx2, gross sensation intact, 5/5 UE strength bilaterally, 4/5 grip strength bilaterally, 4/5 right LE strength and 2/5 left LE strength   Laboratory: Recent Labs  Lab 08/07/21 0138 08/08/21 0158 08/09/21 0137  WBC 10.9* 10.7* 11.6*  HGB 13.3 12.7* 13.1  HCT 38.3* 38.2* 38.2*  PLT 258 272 251   Recent Labs  Lab 08/07/21 0138 08/07/21 1903 08/08/21 0158 08/09/21 0137  NA 126*  --  127* 128*  K 3.8  --  3.8 3.9  CL 91*  --  93* 93*  CO2 24  --  25 23  BUN 10  --  11 16  CREATININE  0.65  --  0.71 0.74  CALCIUM 8.5*  --  8.6* 8.6*  PROT  --  6.4*  --   --   GLUCOSE 132*  --  136* 124*      Imaging/Diagnostic Tests: No results found.   Donney Dice, DO 08/09/2021, 9:21 AM PGY-2, [facility] Intern pager: [phone], text pages welcome

## 2021-08-09 NOTE — Progress Notes (Signed)
Family Medicine Teaching Service Daily Progress Note Intern Pager: [phone]  Patient name: Ronald Sheppard Medical record number: [id number] Date of birth: 09/26/1940 Age: 81 y.o. Gender: male  Primary Care Provider: Raina Mina., MD Consultants: Neurology, urology Code Status: Full  Pt Overview and Major Events to Date:  1/26 admitted 1/28 foley exchanged 2/2 LP insufficient fluid 2/3 Fluoroscopic LP  Assessment and Plan:  Ronald Sheppard is a 81 years old male a fall now with concern for malignancy.  PMH significant for MI with pacemaker and defibrillator, CAD s/p stent, cerebellar stroke, HTN, HLD, BPH and A fib on Eliquis   Isolated LLE Weakness with Lumbosacral Radiculopathy Conus Medullaris Lesion, Unclear Etiology This morning patient had no acute issues. Awaiting cytology results from fluoroscopic LP. Concern for malignancy given workup. Family/patient would like to wait on confirmatory results prior to palliative discussion.  -Neurology following, appreciate care and recs -Await additional CSF studies-cytology -Consider pallaitive discussion once CSF studies return for [name] conversation  Urinary retention, Glans lesion and elevated PSA UOP was 1.275 L. Urology may consider voiding trial later this week although likely neurogenic bladder. Foley currently in place. -Urology following, appreciate recs and care -Ceftriaxone day 6/7 -Monitor clinically  Hyponatremia 2/2 to SIADH Chronic. Na was 123 today  -salt tablets -am BMP -fluid restriction 1200 mL  HTN BP ranges have been 118-144/67-97 -monitor  HLD-atorvastatin  PAF-HR stable, continue eliquis   FEN/GI: renal diet with fluid restricition 1200 ml PPx: eliquis 5 mg BID Dispo:pending PT recs/neuro work uop  Subjective:  No complaints this morning awaiting cytology results  Objective: Temp:  [97.7 F (36.5 C)-98.7 F (37.1 C)] 98.5 F (36.9 C) (02/06 0743) Pulse Rate:  [72-85] 72 (02/06 0743) Resp:   [18-21] 20 (02/06 0743) BP: (118-144)/(67-97) 140/79 (02/06 0743) SpO2:  [95 %] 95 % (02/06 0304) Physical Exam: General: NAD, layign in bed comfortably Cardiovascular: RRR no m/r/g Respiratory: CTAB no w/r/c Abdomen: Nontender to palpation, soft Extremities: No LE edema  Laboratory: Recent Labs  Lab 08/08/21 0158 08/09/21 0137 08/10/21 0245  WBC 10.7* 11.6* 12.3*  HGB 12.7* 13.1 12.3*  HCT 38.2* 38.2* 36.5*  PLT 272 251 247   Recent Labs  Lab 08/07/21 1903 08/08/21 0158 08/09/21 0137 08/10/21 0245  NA  --  127* 128* 123*  K  --  3.8 3.9 3.4*  CL  --  93* 93* 90*  CO2  --  25 23 23   BUN  --  11 16 14   CREATININE  --  0.71 0.74 0.77  CALCIUM  --  8.6* 8.6* 8.1*  PROT 6.4*  --   --   --   GLUCOSE  --  136* 124* 202*      Imaging/Diagnostic Tests:   Gerrit Heck, MD 08/10/2021, 12:11 PM PGY-1, [place] Intern pager: [phone], text pages welcome

## 2021-08-10 DIAGNOSIS — G834 Cauda equina syndrome: Secondary | ICD-10-CM | POA: Diagnosis not present

## 2021-08-10 DIAGNOSIS — Z950 Presence of cardiac pacemaker: Secondary | ICD-10-CM | POA: Diagnosis not present

## 2021-08-10 DIAGNOSIS — R531 Weakness: Secondary | ICD-10-CM | POA: Diagnosis not present

## 2021-08-10 DIAGNOSIS — C801 Malignant (primary) neoplasm, unspecified: Secondary | ICD-10-CM | POA: Diagnosis not present

## 2021-08-10 LAB — CBC
HCT: 36.5 % — ABNORMAL LOW (ref 39.0–52.0)
Hemoglobin: 12.3 g/dL — ABNORMAL LOW (ref 13.0–17.0)
MCH: 28.3 pg (ref 26.0–34.0)
MCHC: 33.7 g/dL (ref 30.0–36.0)
MCV: 83.9 fL (ref 80.0–100.0)
Platelets: 247 10*3/uL (ref 150–400)
RBC: 4.35 MIL/uL (ref 4.22–5.81)
RDW: 13.2 % (ref 11.5–15.5)
WBC: 12.3 10*3/uL — ABNORMAL HIGH (ref 4.0–10.5)
nRBC: 0 % (ref 0.0–0.2)

## 2021-08-10 LAB — CSF CULTURE W GRAM STAIN: Culture: NO GROWTH

## 2021-08-10 LAB — BASIC METABOLIC PANEL
Anion gap: 10 (ref 5–15)
BUN: 14 mg/dL (ref 8–23)
CO2: 23 mmol/L (ref 22–32)
Calcium: 8.1 mg/dL — ABNORMAL LOW (ref 8.9–10.3)
Chloride: 90 mmol/L — ABNORMAL LOW (ref 98–111)
Creatinine, Ser: 0.77 mg/dL (ref 0.61–1.24)
GFR, Estimated: >60 mL/min (ref >60)
Glucose, Bld: 202 mg/dL — ABNORMAL HIGH (ref 70–99)
Potassium: 3.4 mmol/L — ABNORMAL LOW (ref 3.5–5.1)
Sodium: 123 mmol/L — ABNORMAL LOW (ref 135–145)

## 2021-08-10 LAB — CYTOLOGY - NON PAP

## 2021-08-10 MED ORDER — SODIUM CHLORIDE 1 G PO TABS
1.0000 g | ORAL_TABLET | Freq: Three times a day (TID) | ORAL | Status: DC
  Administered 2021-08-10 – 2021-08-20 (×29): 1 g via ORAL
  Filled 2021-08-10 (×35): qty 1

## 2021-08-10 MED ORDER — POTASSIUM CHLORIDE 20 MEQ PO PACK
60.0000 meq | PACK | Freq: Once | ORAL | Status: AC
  Administered 2021-08-10: 60 meq via ORAL
  Filled 2021-08-10: qty 3

## 2021-08-10 MED ORDER — ADULT MULTIVITAMIN W/MINERALS CH
1.0000 | ORAL_TABLET | Freq: Every day | ORAL | Status: DC
  Administered 2021-08-10 – 2021-08-13 (×4): 1 via ORAL
  Filled 2021-08-10 (×4): qty 1

## 2021-08-10 MED ORDER — ENSURE ENLIVE PO LIQD
237.0000 mL | Freq: Two times a day (BID) | ORAL | Status: DC
  Administered 2021-08-11 – 2021-08-18 (×10): 237 mL via ORAL

## 2021-08-10 MED ORDER — JUVEN PO PACK
1.0000 | PACK | Freq: Two times a day (BID) | ORAL | Status: DC
  Administered 2021-08-11 – 2021-08-13 (×5): 1 via ORAL
  Filled 2021-08-10 (×6): qty 1

## 2021-08-10 NOTE — Progress Notes (Signed)
Patient ID: Ronald Sheppard, male   DOB: 04-20-1941, 81 y.o.   MRN: [id number]    Subjective: Patint is stable. Remains in bed. VSS. Spinal tap cytology without malignant cells identified  Objective: Vital signs in last 24 hours: Temp:  [97.7 F (36.5 C)-98.7 F (37.1 C)] 98.2 F (36.8 C) (02/06 1210) Pulse Rate:  [72-85] 75 (02/06 1210) Resp:  [19-21] 20 (02/06 1210) BP: (118-144)/(76-97) 121/80 (02/06 1210) SpO2:  [95 %] 95 % (02/06 0304)  Intake/Output from previous day: 02/05 0701 - 02/06 0700 In: 480 [P.O.:480] Out: 1275 [Urine:1275] Intake/Output this shift: No intake/output data recorded.  Physical Exam:  General: Alert and oriented GU: Scrotal exam is normal. No erythema, testes are palpably normal.  No skin lesions.  Penis demonstrates stable urethral erosion compared to evaluation over the weekend. No evidence of genital infection. Catheter secured with leg securement device without tension on the catheter.   Lab Results: Recent Labs    08/08/21 0158 08/09/21 0137 08/10/21 0245  HGB 12.7* 13.1 12.3*  HCT 38.2* 38.2* 36.5*    BMET Recent Labs    08/09/21 0137 08/10/21 0245  NA 128* 123*  K 3.9 3.4*  CL 93* 90*  CO2 23 23  GLUCOSE 124* 202*  BUN 16 14  CREATININE 0.74 0.77  CALCIUM 8.6* 8.1*    CBC Latest Ref Rng & Units 08/10/2021 08/09/2021 08/08/2021  WBC 4.0 - 10.5 K/uL 12.3(H) 11.6(H) 10.7(H)  Hemoglobin 13.0 - 17.0 g/dL 12.3(L) 13.1 12.7(L)  Hematocrit 39.0 - 52.0 % 36.5(L) 38.2(L) 38.2(L)  Platelets 150 - 400 K/uL 247 251 272     Studies/Results: No results found.  Assessment/Plan: 1) Urinary retention:  May be able to consider voiding trial during hospitalization although will have to see if he shows clinical improvement in his neurologic status.  If not, may be best to continue catheter drainage and consider outpatient urodynamic evaluation.  He does have BPH based on CT imaging. If remains in retention, will discuss eventual option of SPT with  patient as well.   2) Urethral erosion:  Continue to maintain catheter off tension at urethral meatus to prevent further erosion. tilizing leg securement strap to secure the catheter by the drainage tubing leaving it on no tension at all.    LOS: 9 days   Jayme Cham Abu-Salha 08/10/2021, 3:34 PM

## 2021-08-10 NOTE — Progress Notes (Addendum)
Family Medicine Teaching Service Daily Progress Note Intern Pager: [phone]  Patient name: Ronald Sheppard Medical record number: [id number] Date of birth: 09/02/1940 Age: 81 y.o. Gender: male  Primary Care Provider: Raina Mina., MD Consultants: Neurology, urology, neuro oncology Code Status: Full  Pt Overview and Major Events to Date:  1/26 admitted 1/28 foley exchanged 2/2 LP insufficient fluid 2/3 fluoroscopic LP 2/4 cytology negative, neuro oncology consulted   Assessment and Plan:  Ronald Sheppard is a 81 years old male a fall now with concern for malignancy.  PMH significant for MI with pacemaker and defibrillator, CAD s/p stent, cerebellar stroke, HTN, HLD, BPH and A fib on Eliquis    Isolated LLE Weakness with Lumbosacral Radiculopathy Conus Medullaris Lesion, Concern for Malignancy This morning patient had continued weakness of his left lower extremity some sensation loss on the left foot. Cytology returned negative yesterday. Neuro oncology recommended repeat LP with large volume however patient not wanting to do this.  Neurooncology to see him today with daughter.  -Neurology following, appreciate recs -Neuro oncology consulted, appreciate recs -consider palliative discussion for GOC  Sacral Ulcer Unable to visualize on examination today but was noted to have moderate amount of foul-smelling purulent drainage per nursing. -Monitor fever curve -Frequent repositioning -Wound care -Mattress change port ulcer -consider gen surg consult for debridement  Urinary Retention, Glans lesion and elevated PSA UOP was 350 mL charted. Urology saw patient yesterday and could consider suprapubic catheter tube at some point as well.  -urology follwing, appreciate recs -foley in place -ceftriaxone day 7/7 -monitor clinically  Hyponatremia 2/2 to SIADH Chronic, NA was 128 today from 123. -salt tablet TID -am BMP -fluid restriciton 1200 mL  HTN BP ranges were  107-126/68-82. -monitor  Chronic/stable HLD-atorvastatin PAF-HRs stable continue eliquis  FEN/GI: Regular diet with fluid restriction 1200 ml PPx: eliquis 5 mg BID Dispo:pending PT recs/Neuro onc recs  Subjective:  No acute complaints this morning.  Denies any pain but would like to be feeling better.  Objective: Temp:  [97.8 F (36.6 C)-99.5 F (37.5 C)] 98.1 F (36.7 C) (02/07 1222) Pulse Rate:  [59-89] 77 (02/07 1222) Resp:  [18-23] 23 (02/07 1222) BP: (107-126)/(68-82) 119/81 (02/07 1222) SpO2:  [93 %-96 %] 95 % (02/07 1222) Physical Exam: General: NAD, laying in bed comfortably  Cardiovascular: RRR no murmurs rubs or gallops Respiratory: Clear to auscultation bilaterally no wheezes rales or crackles Abdomen: Nondistended, nontender to palpation, soft Extremities: Left lower extremity much weaker than right unable to lift off bed well. left foot with some decrease sensation.  Laboratory: Recent Labs  Lab 08/09/21 0137 08/10/21 0245 08/11/21 0103  WBC 11.6* 12.3* 11.8*  HGB 13.1 12.3* 12.6*  HCT 38.2* 36.5* 37.1*  PLT 251 247 256   Recent Labs  Lab 08/07/21 1903 08/08/21 0158 08/09/21 0137 08/10/21 0245 08/11/21 0103  NA  --    < > 128* 123* 128*  K  --    < > 3.9 3.4* 4.0  CL  --    < > 93* 90* 93*  CO2  --    < > 23 23 25   BUN  --    < > 16 14 14   CREATININE  --    < > 0.74 0.77 0.66  CALCIUM  --    < > 8.6* 8.1* 8.4*  PROT 6.4*  --   --   --   --   GLUCOSE  --    < > 124* 202* 115*   < > =  values in this interval not displayed.      Imaging/Diagnostic Tests: No results found.   Gerrit Heck, MD 08/11/2021, 12:57 PM PGY-1, [facility] Intern pager: [phone], text pages welcome

## 2021-08-10 NOTE — Progress Notes (Signed)
Physical Therapy Treatment Patient Details Name: Ronald Sheppard MRN: [id number] DOB: 1940/08/01 Today's Date: 08/10/2021   History of Present Illness Pt is 81 yo male admitted on 07/30/21-- per family and neurology - pt admitted to [facility]  last week for CVA and was sent to rehab where he developed further back pain, L sided weakness, and slurred speech so sent back to hospital. 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 Neurology consult suspicious for malignancy and spinal tap pending.   Pt with hx including MI with AICD, CAD s/p stent, cerebellar CVA, HTN, HLD, PAF, BPH, and recent CVA per family    PT Comments    Patient participates to his fullest ability, however limited in mobility by LE weakness, left more than right. He participated in LE exercises and then noted to have been incontinent of bowels (pt unaware). Focused on rolling in bed to allow cleaning with pt requiring less assist with better use of rails. Will continue to benefit from skilled PT to address decr strength and mobility.   Recommendations for follow up therapy are one component of a multi-disciplinary discharge planning process, led by the attending physician.  Recommendations may be updated based on patient status, additional functional criteria and insurance authorization.  Follow Up Recommendations  Skilled nursing-short term rehab (<3 hours/day)     Assistance Recommended at Discharge Frequent or constant Supervision/Assistance  Patient can return home with the following Two people to help with walking and/or transfers;Two people to help with  bathing/dressing/bathroom;Assist for transportation;Assistance with cooking/housework   Equipment Recommendations  Other (comment) (defer to post acute)    Recommendations for Other Services       Precautions / Restrictions Precautions Precautions: Fall     Mobility  Bed Mobility Overal bed mobility: Needs Assistance Bed Mobility: Rolling Rolling: Min guard (with rail)         General bed mobility comments: rolling right and left for mobility with pericare/cleaning incorporated as pt incontinent of stool    Transfers                        Ambulation/Gait                   Stairs             Wheelchair Mobility    Modified Rankin (Stroke Patients Only) Modified Rankin (Stroke Patients Only) Pre-Morbid Rankin Score: Slight disability Modified Rankin: Severe disability     Balance                                            Cognition Arousal/Alertness: Awake/alert Behavior During Therapy: WFL for tasks assessed/performed Overall Cognitive Status: No family/caregiver present to determine baseline cognitive functioning                               Problem Solving: Slow processing, Difficulty sequencing, Requires verbal cues, Requires tactile cues General Comments: HOH results in need for repetition of some instructions  Exercises General Exercises - Lower Extremity Ankle Circles/Pumps: AROM, Right, PROM, Left, 10 reps Short Arc Quad: AAROM, Left, 5 reps Heel Slides: AAROM, Both, 10 reps, Strengthening (pt reqired assist for flexion on left; could overcome min resistance into extension; RLE much stronger) Hip ABduction/ADduction: AAROM, 10 reps, Supine, Left    General Comments        Pertinent Vitals/Pain Pain Assessment Pain Assessment: Faces Faces Pain Scale: Hurts whole lot Pain Location: low back with mobility Pain Descriptors / Indicators: Grimacing, Moaning, Discomfort Pain  Intervention(s): Limited activity within patient's tolerance, Monitored during session, Repositioned    Home Living                          Prior Function            PT Goals (current goals can now be found in the care plan section) Acute Rehab PT Goals Patient Stated Goal: decrease pain; be able to use his legs Time For Goal Achievement: 08/14/21 Potential to Achieve Goals: Fair Progress towards PT goals: Progressing toward goals    Frequency    Min 2X/week      PT Plan Current plan remains appropriate;Frequency needs to be updated    Co-evaluation              AM-PAC PT "6 Clicks" Mobility   Outcome Measure  Help needed turning from your back to your side while in a flat bed without using bedrails?: A Lot Help needed moving from lying on your back to sitting on the side of a flat bed without using bedrails?: Total Help needed moving to and from a bed to a chair (including a wheelchair)?: Total Help needed standing up from a chair using your arms (e.g., wheelchair or bedside chair)?: Total Help needed to walk in hospital room?: Total Help needed climbing 3-5 steps with a railing? : Total 6 Click Score: 7    End of Session   Activity Tolerance: Patient tolerated treatment well Patient left: in bed;with call bell/phone within reach;with bed alarm set Nurse Communication: Mobility status;Need for lift equipment;Other (comment) (sacral dressing soiled by loose BM) PT Visit Diagnosis: Other abnormalities of gait and mobility (R26.89);Hemiplegia and hemiparesis;Muscle weakness (generalized) (M62.81)     Time: 5009-3818 PT Time Calculation (min) (ACUTE ONLY): 25 min  Charges:  $Therapeutic Exercise: 23-37 mins                      Arby Barrette, PT Acute Rehabilitation Services  Pager [phone] Office [phone]    Rexanne Mano 08/10/2021, 9:47 AM

## 2021-08-10 NOTE — Progress Notes (Signed)
FPTS Brief Progress Note  S:Pt requesting ice. Has no concerns at his time.    O: BP (!) 144/86 (BP Location: Left Arm)    Pulse 81    Temp 97.7 F (36.5 C) (Oral)    Resp 20    SpO2 95%    GEN: sitting upright in bed RESP: no increased work of breathing   A/P: Ice provided to patient. No change to plan. See AM progress note.  - Orders reviewed. Labs for AM ordered, which was adjusted as needed.    Lyndee Hensen, DO 08/10/2021, 12:10 AM PGY-3, [place] [facility] Night Resident  Please page [phone] with questions.

## 2021-08-10 NOTE — Progress Notes (Addendum)
Initial Nutrition Assessment  DOCUMENTATION CODES:   Not applicable  INTERVENTION:   Recommend liberalizing pt diet to regular due to ongoing poor PO intake and ongoing wounds. Received ok from MD. Ensure Enlive po BID, each supplement provides 350 kcal and 20 grams of protein. 1 packet Juven BID, each packet provides 95 calories, 2.5 grams of protein (collagen), and 9.8 grams of carbohydrate (3 grams sugar); also contains 7 grams of L-arginine and L-glutamine, 300 mg vitamin C, 15 mg vitamin E, 1.2 mcg vitamin B-12, 9.5 mg zinc, 200 mg calcium, and 1.5 g  Calcium Beta-hydroxy-Beta-methylbutyrate to support wound healing Multivitamin w/ minerals daily Recommend obtaining Vitamin A, Vitamin C, Copper, and Zinc labs due to progressing wounds. Received ok from MD. Feeding assist with meals Recommend obtaining weight due to no weight on file. RN messaged.   NUTRITION DIAGNOSIS:   Increased nutrient needs related to wound healing as evidenced by estimated needs.  GOAL:   Patient will meet greater than or equal to 90% of their needs  MONITOR:   PO intake, Supplement acceptance, Labs, Skin  REASON FOR ASSESSMENT:   Consult Wound healing  ASSESSMENT:   81 y.o. male presented to the ED after a fall. PMH includes stroke, CAD, HTN, and has a pacemaker. Pt admitted with weakness, urinary retention, and work-up for possible stroke. Pt neuro exam was normal and no acute hemorrhage on CT.   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   Pt resting in bed, friends at bedside and provide initial information.  Pt reports that he has not been eating much during this admission. Pt friends reports that his breakfast tray was just sitting there when they arrived, that no one woke the pt up to eat.  Per EMR, pt intake includes: 1/29: Breakfast 20% 1/31: Lunch 25% 2/02: Breakfast 25%, Lunch 25%  Pt  reports that he believes that he weights around 240#. No weight or height recorded this admission. RD found in recent notes within Care Everywhere that pt is 6' and ~220#.   Pt friends noted that he needs assistance setting up tray. RD to order feeding assist.  Discussed ONS with pt to promote wound healing. Pt agreeable.   Medications reviewed and include: IV antibiotics Labs reviewed:  - Sodium 123 - Potassium 3.4  NUTRITION - FOCUSED PHYSICAL EXAM:  Flowsheet Row Most Recent Value  Orbital Region No depletion  Upper Arm Region No depletion  Thoracic and Lumbar Region No depletion  Buccal Region No depletion  Temple Region No depletion  Clavicle Bone Region No depletion  Clavicle and Acromion Bone Region No depletion  Scapular Bone Region No depletion  Dorsal Hand Mild depletion  Patellar Region Mild depletion  Anterior Thigh Region Mild depletion  Posterior Calf Region Mild depletion  Edema (RD Assessment) None  Hair Reviewed  Eyes Reviewed  Mouth Reviewed  Skin Reviewed  Nails Reviewed       Diet Order:   Diet Order             Diet renal with fluid restriction Fluid restriction: 1200 mL Fluid; Room service appropriate? No; Fluid consistency: Thin  Diet effective now                   EDUCATION NEEDS:   No education needs have been identified at this time  Skin:  Skin Assessment: Skin Integrity Issues: Skin Integrity Issues:: Unstageable, Stage I Stage I:  R Ankle, L Heel Unstageable: Sacrum  Last BM:  2/02  Height:   Ht Readings from Last 1 Encounters:  08/10/21 6' (1.829 m)    Weight:   Wt Readings from Last 1 Encounters:  No data found for [employer]    Ideal Body Weight:  80.9 kg  BMI:  There is no height or weight on file to calculate BMI.  Estimated Nutritional Needs:   Kcal:  0881-1031  Protein:  115-130 grams  Fluid:  >/= 2.3 L    Ronald Sheppard, RD, LDN Clinical Dietitian See [facility] for contact information.

## 2021-08-10 NOTE — TOC Progression Note (Signed)
Transition of Care [facility]) - Progression Note    Patient Details  Name: Ronald Sheppard MRN: [id number] Date of Birth: 1940/09/23  Transition of Care [facility]) CM/SW [place], LCSW Phone Number: 08/10/2021, 11:17 AM  Clinical Narrative:    CSW continuing to follow.    Expected Discharge Plan: [place] Barriers to Discharge: Continued Medical Work up, Ship broker  Expected Discharge Plan and Services Expected Discharge Plan: [place] In-house Referral: Clinical Social Work     Living arrangements for the past 2 months: Single Family Home                                       Social Determinants of Health (SDOH) Interventions    Readmission Risk Interventions No flowsheet data found.

## 2021-08-10 NOTE — Plan of Care (Signed)
  Problem: Clinical Measurements: Goal: Ability to maintain clinical measurements within normal limits will improve Outcome: Progressing   

## 2021-08-10 NOTE — Progress Notes (Addendum)
Spoke with Dr. Mickeal Skinner with Neuro Oncology and he is willing to see patient tomorrow afternoon as a consult to provide recommendations. Appreciate care and time. Updated daughter Erline Levine and she will be by during lunch time and stay during the afternoon.

## 2021-08-10 NOTE — TOC Progression Note (Signed)
Transition of Care [facility]) - Progression Note    Patient Details  Name: Ronald Sheppard MRN: [id number] Date of Birth: 1941/05/01  Transition of Care [facility]) CM/SW [place], LCSW Phone Number: 08/10/2021, 11:18 AM  Clinical Narrative:    CSW continuing to follow.    Expected Discharge Plan: [place] Barriers to Discharge: Continued Medical Work up, Ship broker  Expected Discharge Plan and Services Expected Discharge Plan: [place] In-house Referral: Clinical Social Work     Living arrangements for the past 2 months: Single Family Home                                       Social Determinants of Health (SDOH) Interventions    Readmission Risk Interventions No flowsheet data found.

## 2021-08-10 NOTE — Progress Notes (Addendum)
FPTS Brief Progress Note  Spoke with Dr. Mickeal Skinner with Neuro Oncology on his recommendations given cytology came back negative. Said likely will need large volume tap as many times cytology can come back negative before malignant cells present. We do not have definitive evidence for primary source malignancy. He does have an enlarged prostate with PSA ~12 but no definitve prostate cancer. Dr. Mickeal Skinner encouraged finding primary source of malignancy as this would affect the treatment. We can consider work-up for lymphoma given scans overall negative for primary malignancy. Given fast onset likely 2/2 to malignancy based on imaging per Dr. Mickeal Skinner but cannot rule out inflammatory causes definitively.  A/P: Discuss with family possible options - Consider flow cytometry  - Consider large volume spinal tap - consider PET scan ([facility] has this capability)  Gerrit Heck, MD 08/10/2021, 2:11 PM PGY-1, [place] [facility] Resident  Please page [phone] with questions.

## 2021-08-11 DIAGNOSIS — R270 Ataxia, unspecified: Secondary | ICD-10-CM

## 2021-08-11 DIAGNOSIS — R339 Retention of urine, unspecified: Secondary | ICD-10-CM | POA: Diagnosis not present

## 2021-08-11 DIAGNOSIS — G834 Cauda equina syndrome: Secondary | ICD-10-CM | POA: Diagnosis not present

## 2021-08-11 DIAGNOSIS — R531 Weakness: Secondary | ICD-10-CM | POA: Diagnosis not present

## 2021-08-11 DIAGNOSIS — C801 Malignant (primary) neoplasm, unspecified: Secondary | ICD-10-CM | POA: Diagnosis not present

## 2021-08-11 LAB — BASIC METABOLIC PANEL
Anion gap: 10 (ref 5–15)
BUN: 14 mg/dL (ref 8–23)
CO2: 25 mmol/L (ref 22–32)
Calcium: 8.4 mg/dL — ABNORMAL LOW (ref 8.9–10.3)
Chloride: 93 mmol/L — ABNORMAL LOW (ref 98–111)
Creatinine, Ser: 0.66 mg/dL (ref 0.61–1.24)
GFR, Estimated: >60 mL/min (ref >60)
Glucose, Bld: 115 mg/dL — ABNORMAL HIGH (ref 70–99)
Potassium: 4 mmol/L (ref 3.5–5.1)
Sodium: 128 mmol/L — ABNORMAL LOW (ref 135–145)

## 2021-08-11 LAB — VDRL, CSF: VDRL Quant, CSF: NONREACTIVE

## 2021-08-11 LAB — CBC
HCT: 37.1 % — ABNORMAL LOW (ref 39.0–52.0)
Hemoglobin: 12.6 g/dL — ABNORMAL LOW (ref 13.0–17.0)
MCH: 28.5 pg (ref 26.0–34.0)
MCHC: 34 g/dL (ref 30.0–36.0)
MCV: 83.9 fL (ref 80.0–100.0)
Platelets: 256 10*3/uL (ref 150–400)
RBC: 4.42 MIL/uL (ref 4.22–5.81)
RDW: 13.2 % (ref 11.5–15.5)
WBC: 11.8 10*3/uL — ABNORMAL HIGH (ref 4.0–10.5)
nRBC: 0 % (ref 0.0–0.2)

## 2021-08-11 MED ORDER — HEPARIN SODIUM (PORCINE) 1000 UNIT/ML IJ SOLN
INTRAMUSCULAR | Status: AC
  Filled 2021-08-11: qty 20

## 2021-08-11 MED ORDER — HEPARIN SODIUM (PORCINE) 1000 UNIT/ML IJ SOLN
INTRAMUSCULAR | Status: AC
  Filled 2021-08-11: qty 1

## 2021-08-11 MED ORDER — LIDOCAINE 2% (20 MG/ML) 5 ML SYRINGE
INTRAMUSCULAR | Status: AC
  Filled 2021-08-11: qty 10

## 2021-08-11 MED ORDER — PROTAMINE SULFATE 10 MG/ML IV SOLN
INTRAVENOUS | Status: AC
  Filled 2021-08-11: qty 5

## 2021-08-11 MED ORDER — ROCURONIUM BROMIDE 10 MG/ML (PF) SYRINGE
PREFILLED_SYRINGE | INTRAVENOUS | Status: AC
  Filled 2021-08-11: qty 20

## 2021-08-11 MED ORDER — EPINEPHRINE 1 MG/10ML IJ SOSY
PREFILLED_SYRINGE | INTRAMUSCULAR | Status: AC
  Filled 2021-08-11: qty 10

## 2021-08-11 MED ORDER — PROTAMINE SULFATE 10 MG/ML IV SOLN
INTRAVENOUS | Status: AC
  Filled 2021-08-11: qty 25

## 2021-08-11 MED ORDER — PHENYLEPHRINE 40 MCG/ML (10ML) SYRINGE FOR IV PUSH (FOR BLOOD PRESSURE SUPPORT)
PREFILLED_SYRINGE | INTRAVENOUS | Status: AC
  Filled 2021-08-11: qty 30

## 2021-08-11 NOTE — Plan of Care (Signed)
°  Problem: Education: Goal: Knowledge of General Education information will improve Description: Including pain rating scale, medication(s)/side effects and non-pharmacologic comfort measures Outcome: Progressing   Problem: Clinical Measurements: Goal: Ability to maintain clinical measurements within normal limits will improve Outcome: Progressing Goal: Will remain free from infection Outcome: Progressing Goal: Cardiovascular complication will be avoided Outcome: Progressing   Problem: Coping: Goal: Level of anxiety will decrease Outcome: Progressing   Problem: Pain Managment: Goal: General experience of comfort will improve Outcome: Progressing   Problem: Safety: Goal: Ability to remain free from injury will improve Outcome: Progressing

## 2021-08-11 NOTE — Progress Notes (Signed)
FPTS Brief Progress Note  S: Pt sleeping    O: BP 122/72 (BP Location: Left Arm)    Pulse 89    Temp 98.2 F (36.8 C) (Oral)    Resp 20    Ht 6' (1.829 m) Comment: Per recent note from Care Everywhere   SpO2 96%    GEN: sleeping, mouth moving slightly  RESP: equal chest rise and fall  A/P: No changes to plan. See AM progress not.  - Orders reviewed. Labs for AM ordered, which was adjusted as needed.    Lyndee Hensen, DO 08/11/2021, 2:38 AM PGY-3, [place] [facility] Night Resident  Please page [phone] with questions.

## 2021-08-11 NOTE — Progress Notes (Addendum)
Pt slept well throughout the night, turned and repositioned every 2 hours. CHG bath and foley care performed, as well as linen change. Pt alert and oriented, tolerated well. Dressing change performed to sacrum, moderate amount of foul smelling purulent drainage noted.

## 2021-08-11 NOTE — Consult Note (Signed)
[facility] Consult Note  Patient Care Team: Raina Mina., MD as PCP - General (Internal Medicine)  CHIEF COMPLAINTS/PURPOSE OF CONSULTATION:  Leg Weakness, ataxia, urinary retention  HISTORY OF PRESENTING ILLNESS:  Ronald Sheppard 81 y.o. male presented with several weeks of progressive neurologic symptoms.  He first noted impairment in balance, leading to several unprovoked falls.  Then he developed lower back and leg/hip pain, retention of urine.  More recently he has complained of left leg weakness, impairing his ability to walk.  Overall his symptoms have been stepwise and progressive in nature.  At present, his pain is controlled.  There is no known cancer history.  MEDICAL HISTORY:  No past medical history on file.  SURGICAL HISTORY:   SOCIAL HISTORY: Social History   Socioeconomic History   Marital status: Married    Spouse name: Not on file   Number of children: Not on file   Years of education: Not on file   Highest education level: Not on file  Occupational History   Not on file  Tobacco Use   Smoking status: Unknown   Smokeless tobacco: Never  Substance and Sexual Activity   Alcohol use: Not on file   Drug use: Not on file   Sexual activity: Not on file  Other Topics Concern   Not on file  Social History Narrative   Not on file   Social Determinants of Health   Financial Resource Strain: Not on file  Food Insecurity: Not on file  Transportation Needs: Not on file  Physical Activity: Not on file  Stress: Not on file  Social Connections: Not on file  Intimate Partner Violence: Not on file    FAMILY HISTORY: No family history on file.  ALLERGIES:  is allergic to amlodipine, other, and penicillins.  MEDICATIONS:  Current Facility-Administered Medications  Medication Dose Route Frequency Provider Last Rate Last Admin   apixaban (ELIQUIS) tablet 5 mg  5 mg Oral BID Ganta, Anupa, DO   5 mg at 08/11/21 0848   aspirin EC  tablet 81 mg  81 mg Oral Daily Erskine Emery, MD   81 mg at 08/11/21 0848   atorvastatin (LIPITOR) tablet 40 mg  40 mg Oral Daily Gifford Shave, MD   40 mg at 08/11/21 0847   Chlorhexidine Gluconate Cloth 2 % PADS 6 each  6 each Topical Daily Lenoria Chime, MD   6 each at 08/11/21 0849   [START ON 08/12/2021] collagenase (SANTYL) ointment   Topical Daily Dickie La, MD       diltiazem (CARDIZEM CD) 24 hr capsule 120 mg  120 mg Oral Daily Gifford Shave, MD   120 mg at 08/11/21 0847   feeding supplement (ENSURE ENLIVE / ENSURE PLUS) liquid 237 mL  237 mL Oral BID BM Dickie La, MD   237 mL at 08/11/21 0849   multivitamin with minerals tablet 1 tablet  1 tablet Oral Daily Dickie La, MD   1 tablet at 08/11/21 0848   nutrition supplement (JUVEN) (JUVEN) powder packet 1 packet  1 packet Oral BID BM Dickie La, MD   1 packet at 08/11/21 1242   sodium chloride tablet 1 g  1 g Oral TID WC Gerrit Heck, MD   1 g at 08/11/21 1242   sodium hypochlorite (DAKIN'S 1/4 STRENGTH) topical solution   Topical BID Dickie La, MD   Given at 08/11/21 0849   tamsulosin (FLOMAX) capsule 0.4 mg  0.4 mg  Oral QHS Gifford Shave, MD   0.4 mg at 08/10/21 2133    REVIEW OF SYSTEMS:   Constitutional: Denies fevers, chills or abnormal weight loss Eyes: Denies blurriness of vision Ears, nose, mouth, throat, and face: Denies mucositis or sore throat Respiratory: Denies cough, dyspnea or wheezes Cardiovascular: Denies palpitation, chest discomfort or lower extremity swelling Gastrointestinal:  Denies nausea, constipation, diarrhea GU: Denies dysuria or incontinence Skin: Denies abnormal skin rashes Neurological: Per HPI Musculoskeletal: Denies joint pain, back or neck discomfort. No decrease in ROM Behavioral/Psych: Denies anxiety, disturbance in thought content, and mood instability   PHYSICAL EXAMINATION: Vitals:   08/11/21 1222 08/11/21 1614  BP: 119/81 116/83  Pulse: 77 78  Resp: (!) 23 (!)  22  Temp: 98.1 F (36.7 C) 97.9 F (36.6 C)  SpO2: 95%    KPS: 60. General: Alert, cooperative, pleasant, in no acute distress Head: Craniotomy scar noted, dry and intact. EENT: No conjunctival injection or scleral icterus. Oral mucosa moist Lungs: Resp effort normal Cardiac: Regular rate and rhythm Abdomen: Soft, non-distended abdomen Skin: No rashes cyanosis or petechiae. Extremities: No clubbing or edema  NEUROLOGIC EXAM: Mental Status: Awake, alert, attentive to examiner. Oriented to self and environment. Language is fluent with intact comprehension.  Pyschomotor slowing noted. Cranial Nerves: Visual acuity is grossly normal. Visual fields are full. Extra-ocular movements intact. No ptosis. Face is symmetric, tongue midline. Motor: Tone and bulk are normal. Power is 3/5 in left leg, 5/5 elsewhere. Reflexes are trace in left leg, no pathologic reflexes present. Intact finger to nose bilaterally Sensory: Grossly intact to light touch and temperature Gait: Non ambulatory   LABORATORY DATA:  I have reviewed the data as listed Lab Results  Component Value Date   WBC 11.8 (H) 08/11/2021   HGB 12.6 (L) 08/11/2021   HCT 37.1 (L) 08/11/2021   MCV 83.9 08/11/2021   PLT 256 08/11/2021   Recent Labs    08/07/21 1903 08/08/21 0158 08/09/21 0137 08/10/21 0245 08/11/21 0103  NA  --    < > 128* 123* 128*  K  --    < > 3.9 3.4* 4.0  CL  --    < > 93* 90* 93*  CO2  --    < > 23 23 25   GLUCOSE  --    < > 124* 202* 115*  BUN  --    < > 16 14 14   CREATININE  --    < > 0.74 0.77 0.66  CALCIUM  --    < > 8.6* 8.1* 8.4*  GFRNONAA  --    < > >60 >60 >60  PROT 6.4*  --   --   --   --    < > = values in this interval not displayed.    RADIOGRAPHIC STUDIES: I have personally reviewed the radiological images as listed and agreed with the findings in the report. CT ANGIO HEAD NECK W WO CM  Result Date: 07/30/2021 CLINICAL DATA:  Left lower extremity weakness EXAM: CT ANGIOGRAPHY HEAD  AND NECK TECHNIQUE: Multidetector CT imaging of the head and neck was performed using the standard protocol during bolus administration of intravenous contrast. Multiplanar CT image reconstructions and MIPs were obtained to evaluate the vascular anatomy. Carotid stenosis measurements (when applicable) are obtained utilizing NASCET criteria, using the distal internal carotid diameter as the denominator. RADIATION DOSE REDUCTION: This exam was performed according to the departmental dose-optimization program which includes automated exposure control, adjustment of the mA and/or kV  according to patient size and/or use of iterative reconstruction technique. CONTRAST:  30mL OMNIPAQUE IOHEXOL 350 MG/ML SOLN COMPARISON:  None. FINDINGS: CT HEAD FINDINGS Brain: No acute hemorrhage. Multiple old cerebellar infarcts. There is generalized atrophy without lobar predilection. Old left occipital infarct and findings of chronic small vessel ischemia. Skull: The visualized skull base, calvarium and extracranial soft tissues are normal. Sinuses/Orbits: No fluid levels or advanced mucosal thickening of the visualized paranasal sinuses. No mastoid or middle ear effusion. The orbits are normal. CTA NECK FINDINGS SKELETON: There is no bony spinal canal stenosis. No lytic or blastic lesion. OTHER NECK: Normal pharynx, larynx and major salivary glands. No cervical lymphadenopathy. Unremarkable thyroid gland. UPPER CHEST: No pneumothorax or pleural effusion. No nodules or masses. AORTIC ARCH: There is no calcific atherosclerosis of the aortic arch. There is no aneurysm, dissection or hemodynamically significant stenosis of the visualized portion of the aorta. Conventional 3 vessel aortic branching pattern. The visualized proximal subclavian arteries are widely patent. RIGHT CAROTID SYSTEM: No dissection, occlusion or aneurysm. Mild atherosclerotic calcification at the carotid bifurcation without hemodynamically significant stenosis. LEFT  CAROTID SYSTEM: No dissection, occlusion or aneurysm. Mild atherosclerotic calcification at the carotid bifurcation without hemodynamically significant stenosis. VERTEBRAL ARTERIES: Right dominant configuration. Both origins are clearly patent. Multifocal atherosclerotic irregularity of the left vertebral artery. There is occlusion of the proximal left V4 segment with distal reconstitution. CTA HEAD FINDINGS POSTERIOR CIRCULATION: --Vertebral arteries: Occlusion versus short segment severe stenosis of the distal right V4 segment. --Inferior cerebellar arteries: Normal. --Basilar artery: Basilar artery is diffusely diminutive. --Superior cerebellar arteries: Normal. --Posterior cerebral arteries (PCA): Multifocal severe stenoses of both posterior cerebral arteries. ANTERIOR CIRCULATION: --Intracranial internal carotid arteries: Atherosclerotic calcification with moderate narrowing bilaterally. --Anterior cerebral arteries (ACA): Normal. Both A1 segments are present. Patent anterior communicating artery (a-comm). --Middle cerebral arteries (MCA): Multifocal atherosclerotic irregularity without occlusion or high-grade stenosis. VENOUS SINUSES: As permitted by contrast timing, patent. ANATOMIC VARIANTS: None Review of the MIP images confirms the above findings. IMPRESSION: 1. No acute intracranial hemorrhage or mass effect. 2. 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 3. No occlusion or hemodynamically significant stenosis in the neck. Electronically Signed   By: Ulyses Jarred M.D.   On: 07/30/2021 21:30   DG Chest 1 View  Result Date: 07/31/2021 CLINICAL DATA:  Left leg weakness since 1430 hours yesterday EXAM: CHEST  1 VIEW COMPARISON:  02/10/2021 FINDINGS: Right basilar atelectasis. No focal consolidation. No pleural effusion or pneumothorax. Heart and mediastinal contours are unremarkable. Dual lead cardiac pacemaker. No acute osseous  abnormality. IMPRESSION: No acute cardiopulmonary disease. Electronically Signed   By: Kathreen Devoid M.D.   On: 07/31/2021 09:06   CT Lumbar Spine Wo Contrast  Result Date: 07/30/2021 CLINICAL DATA:  Low back pain EXAM: CT LUMBAR SPINE WITHOUT CONTRAST TECHNIQUE: Multidetector CT imaging of the lumbar spine was performed without intravenous contrast administration. Multiplanar CT image reconstructions were also generated. RADIATION DOSE REDUCTION: This exam was performed according to the departmental dose-optimization program which includes automated exposure control, adjustment of the mA and/or kV according to patient size and/or use of iterative reconstruction technique. COMPARISON:  CT 07/17/2021 FINDINGS: Segmentation: 5 lumbar type vertebrae. Alignment: Normal. Vertebrae: No acute fracture or focal pathologic process. Paraspinal and other soft tissues: Aortic atherosclerosis. No paravertebral or paraspinal soft tissue abnormality. Disc levels: At T12-L1, maintained disc space. No canal stenosis. The foramen are patent bilaterally. At L1-L2, maintained disc space. No canal stenosis. Mild  bilateral foraminal narrowing. At L2-L3, disc space narrowing and below. Hypertrophic facet degenerative changes. Diffuse disc bulge with mild canal stenosis. Right greater than left foraminal narrowing. At L3-L4, disc space narrowing and vacuum disc. Diffuse disc bulge with moderate canal stenosis. Moderate facet degenerative changes bilaterally. Moderate bilateral foraminal narrowing. At L4-L5, disc space narrowing and vacuum disc. Moderate canal stenosis. Hypertrophic facet degenerative changes. Moderate bilateral foraminal stenosis. At L5-S1, disc space narrowing. Diffuse disc bulge. No canal stenosis. Advanced hypertrophic facet degenerative changes. Moderate bilateral foraminal narrowing. IMPRESSION: 1. No acute osseous abnormality. 2. Redemonstrated advanced lumbar degenerative changes with at least moderate canal  stenosis at L3-L4 and L4-L5. Multilevel foraminal stenosis L2-L3 through L5-S1. Electronically Signed   By: Donavan Foil M.D.   On: 07/30/2021 22:12   MR BRAIN WO CONTRAST  Result Date: 08/03/2021 CLINICAL DATA:  Provided history: Dizziness, persistent/recurrent, cardiac or vascular cause suspected. Stroke-like symptoms. Additional history provided: Left leg weakness for 1 week, slurred speech last Monday, history of stroke. EXAM: MRI HEAD WITHOUT CONTRAST TECHNIQUE: Multiplanar, multiecho pulse sequences of the brain and surrounding structures were obtained without intravenous contrast. COMPARISON:  CT angiogram head/neck 07/30/2021. FINDINGS: Brain: The examination is intermittently motion degraded, limiting evaluation. Most notably, there is moderate motion degradation of the axial T2 TSE sequence and moderate/severe motion a shin of the axial T1 weighted sequence. Mild-to-moderate generalized cerebral and cerebellar atrophy. Small chronic cortical/subcortical infarct within the left occipital lobe. Mild scattered and ill-defined T2 FLAIR hyperintense signal abnormality elsewhere within the cerebral white matter, nonspecific but compatible chronic small vessel ischemic disease. Numerous chronic infarcts within the bilateral cerebellar hemispheres. There is no acute infarct. No evidence of an intracranial mass. No chronic intracranial blood products. No extra-axial fluid collection. No midline shift. Vascular: Signal abnormality within the V4 left vertebral artery corresponding with the occlusion versus severe stenosis demonstrated at this site on the CTA head/neck of 07/30/2021. Skull and upper cervical spine: No focal suspicious marrow lesion. Sinuses/Orbits: Visualized orbits show no acute finding. Trace mucosal thickening versus fluid within the bilateral ethmoid air cells. Small mucous retention cyst within the inferior left maxillary sinus. Other: Small right mastoid effusion. Trace fluid also present  within the left mastoid air cells. IMPRESSION: 1. Motion degraded examination, as described. 2. The diffusion-weighted imaging is of good quality. No evidence of acute infarction. 3. No acute intracranial abnormality is identified. 4. Small chronic cortical/subcortical infarct within the left occipital lobe (PCA vascular territory). 5. Background mild chronic small-vessel ischemic changes within the cerebral white matter. 6. Numerous chronic infarcts within the bilateral cerebellar hemispheres. 7. Mild-to-moderate generalized cerebral and cerebellar atrophy. 8. Signal abnormality within the V4 left vertebral artery corresponding with the occlusion versus severe stenosis demonstrated at this site on the CTA head/neck of 07/30/2021. 9. Small right mastoid effusion. Electronically Signed   By: Kellie Simmering D.O.   On: 08/03/2021 16:23   MR BRAIN W CONTRAST  Result Date: 08/05/2021 CLINICAL DATA:  Left leg weakness and numbness, concern for malignancy EXAM: MRI HEAD WITH CONTRAST TECHNIQUE: Multiplanar, multiecho pulse sequences of the brain and surrounding structures were obtained with intravenous contrast. CONTRAST:  72mL GADAVIST GADOBUTROL 1 MMOL/ML IV SOLN COMPARISON:  Brain MRI 08/03/2021 FINDINGS: Brain: Parenchymal volume is stable. The ventricles are stable in size. Remote infarcts are again seen in the bilateral cerebellar hemispheres. 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 There is a small focus of enhancement in  the left IAC (5-11). There is no other abnormal intracranial enhancement. There is no mass effect or midline shift. Vascular: The vessels are grossly unremarkable on the provided sequences. Skull and upper cervical spine: Normal marrow signal. Sinuses/Orbits: Grossly unremarkable on the provided sequences. Other: None. IMPRESSION: 1. Enhancement along the peel surface of the cervicomedullary junction and  pons suspicious for leptomeningeal spread of malignancy given findings on prior spine imaging. Infectious or inflammatory etiology is possible but considered less likely. Recommend correlation with lumbar puncture. 2. Small focus of enhancement in the left IAC may reflect leptomeningeal disease on the 7/8 cranial nerve versus a small schwannoma. Prior contrast enhanced imaging of the brain, if available, would be helpful for comparison. 3. No other evidence of intracranial metastatic disease. Electronically Signed   By: Valetta Mole M.D.   On: 08/05/2021 14:32   MR THORACIC SPINE WO CONTRAST  Result Date: 08/03/2021 CLINICAL DATA:  Midthoracic region back pain. Left leg weakness over the last week. EXAM: MRI THORACIC SPINE WITHOUT CONTRAST TECHNIQUE: Multiplanar, multisequence MR imaging of the thoracic spine was performed. No intravenous contrast was administered. COMPARISON:  CT 07/17/2021 FINDINGS: Alignment:  No thoracic malalignment. Vertebrae: No fracture or focal bone lesion. As better shown by CT, there are solid bridging osteophytes anteriorly throughout the thoracic region. Cord: Patchy abnormal T2 signal is seen scattered within the thoracic cord beginning at the T3 level extending all the way to the conus tip. This is particularly pronounced in the distal thoracic cord at the T11 and T12 levels where the cord shows some swelling. Paraspinal and other soft tissues: Negative except for cholelithiasis. Disc levels: No disc level pathology. No stenosis of the canal or foramina. Minimal non-compressive disc bulge at T3-4. Mild facet and ligamentous hypertrophy in the lower thoracic region but without compressive effect upon the canal. IMPRESSION: Scattered areas of abnormal T2 signal within the thoracic spinal cord from T3 to the conus tip. This is particularly advanced at T11 and T12 where there is cord swelling. Differential diagnosis is that of demyelinating disease versus ischemic myelopathy. I spoke  with the technologist who is in the process of performing the lumbar scan. We are going to get contrast administered so we can see the distal cord. Electronically Signed   By: Nelson Chimes M.D.   On: 08/03/2021 16:31   MR Lumbar Spine W Wo Contrast  Result Date: 08/03/2021 CLINICAL DATA:  Spinal stenosis, lumbar EXAM: MRI LUMBAR SPINE WITHOUT AND WITH CONTRAST TECHNIQUE: Multiplanar and multiecho pulse sequences of the lumbar spine were obtained without and with intravenous contrast. CONTRAST:  74mL GADAVIST GADOBUTROL 1 MMOL/ML IV SOLN COMPARISON:  CT lumbar spine 07/30/2021. FINDINGS: Segmentation:  Standard. Alignment:  Normal. Vertebrae: Mild degenerative/discogenic endplate signal changes at L3-L4 and L4-L5 posteriorly with Schmorl's nodes. Otherwise, no focal marrow edema chest acute fracture discitis/osteomyelitis. Mildly heterogeneous bone marrow without suspicious bone lesion. Conus medullaris and cauda equina: Conus extends to the L1 level. Abnormal T2 hyperintense signal within the conus. Patchy enhancement in the conus and areas of the lower thoracic cord, which appears to be predominantly pial. There is also abnormal diffuse enhancement of the cauda quinine nerve roots. Paraspinal and other soft tissues: Unremarkable. Disc levels: T12-L1: No significant disc protrusion, foraminal stenosis, or canal stenosis. L1-L2: Mild disc bulging and facet arthropathy without significant stenosis. L2-L3: Disc bulging, eccentric to the right. Right greater than left facet arthropathy. Resulting moderate right and mild left foraminal stenosis. Mild canal stenosis with right greater than  left subarticular recess narrowing. L3-L4: Disc bulging and endplate spurring. Bilateral facet arthropathy. Ligamentum flavum thickening. Resulting moderate bilateral foraminal stenosis and moderate canal stenosis with bilateral subarticular recess narrowing. L4-L5: Right eccentric disc bulge. Moderate bilateral facet arthropathy.  Ligamentum flavum thickening. Resulting moderate right greater than left foraminal stenosis and mild canal stenosis with right greater than left subarticular recess narrowing. L5-S1: Right eccentric disc bulge with bilateral facet arthropathy. Resulting severe right and moderate left foraminal stenosis. No significant canal stenosis. Narrowing of the right subarticular recess. IMPRESSION: 1. Abnormal signal within the conus with extensive abnormal enhancement in the conus, visualized lower thoracic cord, and the cauda equina nerve roots diffusely. Differential considerations include malignancy (including CSF disseminated metastases), inflammation (including neurosarcoidosis, although there were no findings to suggest sarcoidosis on recent CT chest), infection (including atypical etiologies), and demyelination (including Guillain-Barr Syndrome). Multiple sclerosis is thought less likely given patient age/sex and extensive leptomeningeal/cauda equina enhancement. Recommend correlation with lumbar puncture. Also, postcontrast imaging of the brain may be worthwhile to evaluate for abnormal intracranial enhancement. 2. At L5-S1, severe right and moderate left foraminal stenosis. 3. At L3-L4, moderate canal and bilateral foraminal stenosis. 4. At L4-L5 moderate right greater than left foraminal stenosis and mild canal stenosis. 5. At L2-L3, moderate right and mild left foraminal stenosis. Electronically Signed   By: Margaretha Sheffield M.D.   On: 08/03/2021 18:00   DG Pelvis Portable  Result Date: 08/02/2021 CLINICAL DATA:  Weakness. Pelvis and hip pain. EXAM: PORTABLE PELVIS 1-2 VIEWS COMPARISON:  None. FINDINGS: There is no evidence of pelvic fracture or diastasis. No pelvic bone lesions are seen. Hips are unremarkable in appearance on this one view pelvis radiograph. Lower lumbar spine degenerative changes are noted. IMPRESSION: No acute findings. Electronically Signed   By: Marlaine Hind M.D.   On: 08/02/2021 17:18    CT CHEST ABDOMEN PELVIS W CONTRAST  Result Date: 08/04/2021 CLINICAL DATA:  Shortness of breath, evaluate for metastatic disease EXAM: CT CHEST, ABDOMEN, AND PELVIS WITH CONTRAST TECHNIQUE: Multidetector CT imaging of the chest, abdomen and pelvis was performed following the standard protocol during bolus administration of intravenous contrast. RADIATION DOSE REDUCTION: This exam was performed according to the departmental dose-optimization program which includes automated exposure control, adjustment of the mA and/or kV according to patient size and/or use of iterative reconstruction technique. CONTRAST:  165mL OMNIPAQUE IOHEXOL 300 MG/ML  SOLN COMPARISON:  CT chest done on 07/17/2021 FINDINGS: CT CHEST FINDINGS Cardiovascular: Coronary artery calcifications and possible coronary artery stent are noted. There is homogeneous enhancement in thoracic aorta. Ascending thoracic aorta measures 4 cm. There are no intraluminal filling defects in the main pulmonary artery branches in the mediastinum. Mediastinum/Nodes: There are subcentimeter nodes in the mediastinum and hilar regions. There are calcified nodes in the subcarinal region of mediastinum and right hilum. Lungs/Pleura: There is no focal pulmonary consolidation. There are no discrete lung nodules. There are small linear densities in both lower lung fields. This may suggest scarring or subsegmental atelectasis. There is no pleural effusion or pneumothorax. Musculoskeletal: Unremarkable. CT ABDOMEN PELVIS FINDINGS Hepatobiliary: No focal abnormality is seen in the liver. There are calcified gallbladder stones. Pancreas: No focal abnormality is seen. Spleen: There are calcified granulomas in the spleen. Adrenals/Urinary Tract: Adrenals are unremarkable. There is no hydronephrosis. There are few left renal stones largest measuring 9 mm. There is no significant dilation of the ureters. No definite calcific densities seen in the courses of the ureters. There is  mild diffuse wall  thickening in the urinary bladder. Stomach/Bowel: Stomach is unremarkable. Small bowel loops are not dilated. Appendix is not dilated. There is no focal pericecal inflammation. There is no significant wall thickening in colon. There is no pericolic stranding or fluid collection. Vascular/Lymphatic: Scattered arterial calcifications are seen. No significant lymphadenopathy seen Reproductive: There is marked enlargement of prostate. There is low-density tubular structure in the course of the urethra. There is 6 mm calcific density in the dependent portion of urinary bladder close to the urethrovesical junction. Other: There is no ascites or pneumoperitoneum. Musculoskeletal: No focal lytic lesions are seen. In image 96 of series 3, there are small sclerotic densities in the right iliac bone and right pedicle of L5 vertebra. Small sclerotic densities seen in the neck of left femur. There is small sclerotic density in body of C6 vertebra. Degenerative changes are noted with bony spurs, encroachment of neural foramina and spinal stenosis in the lumbar spine. IMPRESSION: There is no evidence of focal pulmonary consolidation or discrete lung nodules. No significant lymphadenopathy is seen in the chest, abdomen and pelvis. No focal abnormality is seen in the liver. There is no significant enlargement of adrenals. There are few sclerotic densities in the bony structures as described in the body of the report suggesting bone islands or sclerotic metastatic disease. If clinically warranted, radionuclide bone scan may be considered. There is marked enlargement of prostate suggesting prostatic hypertrophy or neoplasm. There is wall thickening in the urinary bladder which may be due to chronic outlet obstruction or cystitis. Calculi are seen in the left kidney and urinary bladder. Gallbladder stones.  Coronary artery disease. Other findings as described in the body of the report. Electronically Signed   By:  Elmer Picker M.D.   On: 08/04/2021 12:03   DG FL GUIDED LUMBAR PUNCTURE  Result Date: 08/07/2021 CLINICAL DATA:  Left leg weakness and numbness, concern for metastatic disease EXAM: DIAGNOSTIC LUMBAR PUNCTURE UNDER FLUOROSCOPIC GUIDANCE COMPARISON:  None FLUOROSCOPY: Radiation Exposure Index (if provided by the fluoroscopic device): 36.7 mGy PROCEDURE: Informed consent was obtained from the patient prior to the procedure, including potential complications of headache, allergy, and pain. With the patient prone, the lower back was prepped with Betadine. 1% Lidocaine was used for local anesthesia. Lumbar puncture was performed at the L5-S1 level using a 20 gauge needle with return of blood tinged CSF. 10 ml of CSF were obtained for laboratory studies. The procedure was complicated by patient inability to hold still. Thus, multiple attempts were made to access the subarachnoid space. The patient tolerated the procedure well. IMPRESSION: Successful fluoroscopic guided lumbar puncture as described above. Read and performed by: Alexandria Lodge, PA-C Electronically Signed   By: Macy Mis M.D.   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  MRI demonstrates tumorigenic foci within various locations along the spine and brainstem, consistent with picture of carcinogenic leptomeningeal dissemination.  Common primary malignancies which extend to the leptomeningeal space include lymphoma, small cell lung ca.    Because no primary malignancy has been identified, we recommended further oncologic workup.  Repeat CSF analysis for cytology (up to 3 CSF samples may be required to identify LMD) and FDG-PET imaging could yield a primary, if one exists.  Alternately, but I think less likely, this could be consistent with uncommon presentation of neurosarcoidosis  or infectious meningitis.  Neuro-hospitalist or  neuroradiology team may be more familiar with such cases.    Extensive discussion with daughter took place, including goals of care with suspected malignancy given aggressive and incurable nature of LMD.    Please keep [place] in the loop as workup progresses later this week.   All questions were answered. The patient knows to call the clinic with any problems, questions or concerns.  The total time spent in the encounter was 55 minutes and more than 50% was on counseling and review of test results     Ventura Sellers, MD 08/11/2021 5:12 PM

## 2021-08-11 NOTE — Progress Notes (Signed)
Family Medicine Teaching Service Daily Progress Note Intern Pager: [phone]  Patient name: Ronald Sheppard Medical record number: [id number] Date of birth: May 23, 1941 Age: 81 y.o. Gender: male  Primary Care Provider: Raina Mina., MD Consultants: Neurology, urology, neuro oncology Code Status: Full  Pt Overview and Major Events to Date:  1/26 admitted 1/28 Foley exchanged 2/2 LP insufficient fluid 2/3 fluoroscopic LP 2/4 cytology negative, neuro oncology consulted  Assessment and Plan:  Ronald Sheppard is a 81 year old male presenting with falls now with concern for malignancy.  PMH significant for MI with pacemaker and defibrillator, CAD s/p stent, cerebellar stroke, HTN, HLD, BPH and A fib on Eliquis   Isolated LLE weakness with lumbosacral radiculopathy   conus medullaris lesion, concern for malignancy This morning patient feels like he would like to get out of the hospital and is currently not interested in lumbar tap.  He tells me that he would like additional work-up and he would like "time outside of the hospital." Neuro oncology met with patient yesterday and had extensive discussion with patient and daughter including goals of care with suspected malignancy and LMD.  Recommended further oncologic work-up with primary malignant CT to be identified.  Repeat CSF analysis which sometimes requires 3 samples and FDG PET imaging.  Discussed with daughter this morning on food and agreeable to goals of care conversation -Neuro-oncology care recommendations appreciated -Palliative consulted  Sacral ulcer Unstageable wound to sacrum.  Eschar obscuring to wound per wound care nurse.  Mattress replacement was in place. -Nutrition consulted, appreciate recommendations -Consider general surgery consultation -Wound care routinely -Frequent repositioning  Urinary retention, glans penis and elevated PSA Urine output was 1.95 L.  Urology sent can consider suprapubic catheter tube at a later  time as well. 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 -Salt tablet 3 times daily -AM BMP -Fluid restriction d/c'd, monitor  HTN Blood pressures were 116-143/74-89. -Monitor  Chronic/stable HLD-atorvastatin PAF-heart rates have been stable continue Eliquis  FEN/GI: Regular diet  PPx: Eliquis 5 mg twice daily Dispo: Continued medical work-up  Subjective:  Patient discussed with me extensively that he does not want to lumbar puncture and does not want additional testing.  Would like daughter to be involved in conversation with goals of care.  Would like to have to stay in the hospital for longer and wants to enjoy his time.  Objective: Temp:  [97.9 F (36.6 C)-98.3 F (36.8 C)] 98 F (36.7 C) (02/08 1156) Pulse Rate:  [67-91] 71 (02/08 1156) Resp:  [20-25] 25 (02/08 1156) BP: (116-143)/(74-89) 129/82 (02/08 1156) SpO2:  [94 %-96 %] 95 % (02/08 0400) Physical Exam: General: NAD, alert and responsive Cardiovascular: RRR no murmurs rubs or gallops Respiratory: Clear to auscultation bilaterally no wheezes rales or crackles Abdomen: Nontender, nondistended, soft Extremities: No lower extremity edema, sensation loss up until knee on left side but perfused  Laboratory: Recent Labs  Lab 08/10/21 0245 08/11/21 0103 08/12/21 0106  WBC 12.3* 11.8* 11.3*  HGB 12.3* 12.6* 12.3*  HCT 36.5* 37.1* 36.2*  PLT 247 256 260   Recent Labs  Lab 08/07/21 1903 08/08/21 0158 08/10/21 0245 08/11/21 0103 08/12/21 0106  NA  --    < > 123* 128* 126*  K  --    < > 3.4* 4.0 3.6  CL  --    < > 90* 93* 91*  CO2  --    < >  _0 BUN  --    < > _1 CREATININE  --    < > 0.77 0.66 0.63  CALCIUM  --    < > 8.1* 8.4* 8.3*  PROT 6.4*  --   --   --  5.9*  BILITOT  --   --   --   --  0.7  ALKPHOS  --   --   --   --  53  ALT  --   --   --    --  16  AST  --   --   --   --  12*  GLUCOSE  --    < > 202* 115* 118*   < > = values in this interval not displayed.    Imaging/Diagnostic Tests:   Gerrit Heck, MD 08/12/2021, 2:07 PM PGY-1, [facility] Intern pager: [phone], text pages welcome

## 2021-08-11 NOTE — Progress Notes (Signed)
Occupational Therapy Treatment Patient Details Name: Ronald Sheppard MRN: [id number] DOB: 07-11-1940 Today's Date: 08/11/2021   History of present illness Pt is 81 yo male admitted on 07/30/21-- per family and neurology - pt admitted to [facility]  last week for CVA and was sent to rehab where he developed further back pain, L sided weakness, and slurred speech so sent back to hospital. 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 Neurology consult suspicious for malignancy and spinal tap pending.   Pt with hx including MI with AICD, CAD s/p stent, cerebellar CVA, HTN, HLD, PAF, BPH, and recent CVA per family   OT comments  Pt making slow progress with functional goals. Session focused on bd mobility to sit EOB mod - max A, Pt participated in sitting balance/tolerance tasks seated EOB. Pt sat EOB ~12 minutes. Pt begins to lose his balance when engaged in ADL tasks with poor awareness and no righting reactions; difficulty donning right sleeve of gown due to losing balance to R side   Recommendations for follow up therapy are one component of a multi-disciplinary discharge planning process, led by the attending physician.  Recommendations may be updated based on patient status, additional functional criteria and insurance authorization.    Follow Up Recommendations  Skilled nursing-short term rehab (<3 hours/day)    Assistance Recommended at Discharge Frequent or constant Supervision/Assistance  Patient can return home with the following  Two people to help with walking and/or transfers;A lot of help with bathing/dressing/bathroom;Assistance with  cooking/housework;Direct supervision/assist for medications management;Direct supervision/assist for financial management;Assist for transportation;Help with stairs or ramp for entrance   Equipment Recommendations  Other (comment) (TBD at SNF)    Recommendations for Other Services      Precautions / Restrictions Precautions Precautions: Fall Restrictions Weight Bearing Restrictions: No       Mobility Bed Mobility Overal bed mobility: Needs Assistance Bed Mobility: Rolling, Sidelying to Sit, Sit to Sidelying           General bed mobility comments: Pt sat EOB with max A to elevate trunk and mod A with LEs off EOB, max A LEs back onto bed. pt able to scoot self to [facility] mod A using rails and bed controls tilting bed back    Transfers                         Balance Overall balance assessment: Needs assistance Sitting-balance support: Bilateral upper extremity supported, Feet supported Sitting balance-Leahy Scale: Poor Sitting balance - Comments: initially max A, but progressed to min A Postural control: Right lateral lean                                 ADL either performed or assessed with clinical judgement   ADL Overall ADL's : Needs assistance/impaired     Grooming: Wash/dry hands;Wash/dry face;Oral care;Sitting;Min guard           Upper Body Dressing : Minimal assistance;Min guard;Sitting                     General ADL Comments: Pt participated in sitting balance/tolerance tasks  seated EOB. Pt sat EOB ~12 minutes. Pt begins to lose his balance when engaged in ADL tasks with poor awareness and no righting reactions; difficulty donning right sleeve of gown due to losing balance to R side    Extremity/Trunk Assessment Upper Extremity Assessment Upper Extremity Assessment: Generalized weakness   Lower Extremity Assessment Lower Extremity Assessment: Defer to PT evaluation   Cervical / Trunk Assessment Cervical / Trunk  Assessment: Other exceptions Cervical / Trunk Exceptions: back pain    Vision Baseline Vision/History: 1 Wears glasses Ability to See in Adequate Light: 0 Adequate Patient Visual Report: No change from baseline     Perception     Praxis      Cognition Arousal/Alertness: Awake/alert Behavior During Therapy: WFL for tasks assessed/performed Overall Cognitive Status: No family/caregiver present to determine baseline cognitive functioning                               Problem Solving: Slow processing, Difficulty sequencing, Requires verbal cues, Requires tactile cues General Comments: HOH results in need for repetition of some instructions        Exercises Other Exercises Other Exercises: Pt participated in sitting balance/tolerance tasks seated EOB. Pt sat EOB ~12 minutes    Shoulder Instructions       General Comments      Pertinent Vitals/ Pain       Pain Assessment Pain Assessment: Faces Faces Pain Scale: Hurts even more Pain Location: low back with mobility Pain Descriptors / Indicators: Grimacing, Discomfort, Moaning Pain Intervention(s): Monitored during session, Repositioned, Limited activity within patient's tolerance  Home Living                                          Prior Functioning/Environment              Frequency  Min 2X/week        Progress Toward Goals  OT Goals(current goals can now be found in the care plan section)  Progress towards OT goals: Progressing toward goals     Plan Discharge plan remains appropriate;Frequency remains appropriate    Co-evaluation                 AM-PAC OT "6 Clicks" Daily Activity     Outcome Measure   Help from another person eating meals?: None Help from another person taking care of personal grooming?: A Little Help from another person toileting, which includes using toliet, bedpan, or urinal?: Total Help from another person bathing (including washing,  rinsing, drying)?: A Lot Help from another person to put on and taking off regular upper body clothing?: A Little Help from another person to put on and taking off regular lower body clothing?: Total 6 Click Score: 14    End of Session    OT Visit Diagnosis: Other abnormalities of gait and mobility (R26.89);Muscle weakness (generalized) (M62.81);Pain;Other symptoms and signs involving cognitive function Pain - part of body:  (back)   Activity Tolerance Patient limited by fatigue   Patient Left in bed;with call bell/phone within reach;with bed alarm set   Nurse Communication          Time: 1950-9326 OT Time Calculation (min): 28 min  Charges: OT General Charges $OT Visit: 1 Visit OT Treatments $Self Care/Home Management : 8-22 mins $Therapeutic Activity: 8-22 mins  Emmit Alexanders [facility] 08/11/2021, 3:29 PM

## 2021-08-12 DIAGNOSIS — G834 Cauda equina syndrome: Secondary | ICD-10-CM | POA: Diagnosis not present

## 2021-08-12 DIAGNOSIS — C801 Malignant (primary) neoplasm, unspecified: Secondary | ICD-10-CM | POA: Diagnosis not present

## 2021-08-12 DIAGNOSIS — L89300 Pressure ulcer of unspecified buttock, unstageable: Secondary | ICD-10-CM | POA: Diagnosis not present

## 2021-08-12 DIAGNOSIS — R531 Weakness: Secondary | ICD-10-CM | POA: Diagnosis not present

## 2021-08-12 LAB — CBC
HCT: 36.2 % — ABNORMAL LOW (ref 39.0–52.0)
Hemoglobin: 12.3 g/dL — ABNORMAL LOW (ref 13.0–17.0)
MCH: 28.5 pg (ref 26.0–34.0)
MCHC: 34 g/dL (ref 30.0–36.0)
MCV: 83.8 fL (ref 80.0–100.0)
Platelets: 260 10*3/uL (ref 150–400)
RBC: 4.32 MIL/uL (ref 4.22–5.81)
RDW: 13.2 % (ref 11.5–15.5)
WBC: 11.3 10*3/uL — ABNORMAL HIGH (ref 4.0–10.5)
nRBC: 0 % (ref 0.0–0.2)

## 2021-08-12 LAB — COMPREHENSIVE METABOLIC PANEL
ALT: 16 U/L (ref 0–44)
AST: 12 U/L — ABNORMAL LOW (ref 15–41)
Albumin: 2.5 g/dL — ABNORMAL LOW (ref 3.5–5.0)
Alkaline Phosphatase: 53 U/L (ref 38–126)
Anion gap: 10 (ref 5–15)
BUN: 15 mg/dL (ref 8–23)
CO2: 25 mmol/L (ref 22–32)
Calcium: 8.3 mg/dL — ABNORMAL LOW (ref 8.9–10.3)
Chloride: 91 mmol/L — ABNORMAL LOW (ref 98–111)
Creatinine, Ser: 0.63 mg/dL (ref 0.61–1.24)
GFR, Estimated: >60 mL/min (ref >60)
Glucose, Bld: 118 mg/dL — ABNORMAL HIGH (ref 70–99)
Potassium: 3.6 mmol/L (ref 3.5–5.1)
Sodium: 126 mmol/L — ABNORMAL LOW (ref 135–145)
Total Bilirubin: 0.7 mg/dL (ref 0.3–1.2)
Total Protein: 5.9 g/dL — ABNORMAL LOW (ref 6.5–8.1)

## 2021-08-12 LAB — COPPER, SERUM: Copper: 124 ug/dL (ref 69–132)

## 2021-08-12 LAB — ZINC: Zinc: 81 ug/dL (ref 44–115)

## 2021-08-12 LAB — VITAMIN C: Vitamin C: 0.8 mg/dL (ref 0.4–2.0)

## 2021-08-12 NOTE — Progress Notes (Signed)
Asked patient if it was an okay time to redo his sacral wound and apply new dressing.  He asked if we could wait until tomorrow.  He said he knows we try and do the best we can, but he is not up to it today and wanted to rest.  Will try again tomorrow.

## 2021-08-12 NOTE — Progress Notes (Signed)
FPTS Interim Progress Note  Spoke to daughter to provide update and inquire about involvement of palliative care. She is agreeable to getting the palliative team involved at this time for assistance with further goals of care discussions. She is available almost everyday after 1 pm but will need time to get here as she lives in [place]. Patient and daughter are aware and wanting to be discharged to a facility when the time is appropriate. They are still considering continuing further workup after extensive discussion with Dr. Mickeal Skinner, the oncologist, yesterday. Palliative care consult placed, greatly appreciate involvement and assistance with goals of care discussions moving forward.   Donney Dice, DO 08/12/2021, 9:34 AM PGY-2, [place] Medicine Service pager [phone]

## 2021-08-12 NOTE — Progress Notes (Signed)
FPTS Brief Progress Note  S: Pt reports of right hip pain that he thinks is related to being in the bed for a long period of time. Describes it as joint pain and is requesting tylenol.    O: BP 133/73 (BP Location: Right Arm)    Pulse 81    Temp 99.2 F (37.3 C) (Oral)    Resp 20    Ht 6' (1.829 m) Comment: Per recent note from Care Everywhere   SpO2 95%   General: NAD, pleasant, able to participate in exam Respiratory: No respiratory distress Skin: warm and dry, no rashes noted Psych: Normal affect and mood MSK: Able to move extremities    A/P: [name] is scheduled for a palliative care discussion tomorrow with family to discuss goals of care given that he does not want to continue with recommendation of large volume tap from neuro-oncology. Currently hemodynamically stable, will continue to monitor. Ordered Tylenol 650 mg for his hip pain, rest of plan per day team.    Erskine Emery, MD 08/12/2021, 11:02 PM PGY-1, [place] Night Resident  Please page [phone] with questions.

## 2021-08-12 NOTE — Progress Notes (Signed)
FPTS Interim Progress Note  S:Patient sleeping and resting comfortably.    O: BP 124/80 (BP Location: Right Arm)    Pulse 80    Temp 98 F (36.7 C) (Oral)    Resp (!) 24    Ht 6' (1.829 m) Comment: Per recent note from Care Everywhere   SpO2 94%    GEN: sleeping  RESP: equal chest rise and fall    A/P:  No changes to current plan. See daily progress note.  - Orders reviewed. Labs for AM ordered, which was adjusted as needed.   Lyndee Hensen, DO 08/12/2021, 2:00 AM PGY-3, [place] [facility] Night Resident  Please page [phone] with questions.

## 2021-08-12 NOTE — Progress Notes (Signed)
° ° ° °  Referral received for Ronald Sheppard for goals of care discussion. Chart reviewed and updates received from RN.  I spoke with the patient's daughter on the phone and she gave some background on his home/living situation.  She is wondering about possible coverage of long-term care for her father and I suggested I would reach out to [facility] to look into this.  We agreed to a family meeting tomorrow at 1:00 PM.    I went to the patient's room and introduced palliative medicine and our scope/purpose.  I communicated that I had spoken with his daughter and we are planning for goals of care discussion tomorrow at 1 PM.  He is in agreement and states that he wanted want to talk about any decisions with his daughter so he is happy that she will be involved.  No acute concerns expressed today.  Detailed note and recommendations to follow once GOC has been completed.   Thank you for your referral and allowing PMT to assist in [facility] care.   Walden Field, NP Palliative Medicine Team Phone: [phone]  NO CHARGE

## 2021-08-13 DIAGNOSIS — Z7189 Other specified counseling: Secondary | ICD-10-CM

## 2021-08-13 DIAGNOSIS — G834 Cauda equina syndrome: Secondary | ICD-10-CM | POA: Diagnosis not present

## 2021-08-13 LAB — CBC
HCT: 36.5 % — ABNORMAL LOW (ref 39.0–52.0)
Hemoglobin: 12.5 g/dL — ABNORMAL LOW (ref 13.0–17.0)
MCH: 28.9 pg (ref 26.0–34.0)
MCHC: 34.2 g/dL (ref 30.0–36.0)
MCV: 84.5 fL (ref 80.0–100.0)
Platelets: 301 10*3/uL (ref 150–400)
RBC: 4.32 MIL/uL (ref 4.22–5.81)
RDW: 13.2 % (ref 11.5–15.5)
WBC: 11.6 10*3/uL — ABNORMAL HIGH (ref 4.0–10.5)
nRBC: 0 % (ref 0.0–0.2)

## 2021-08-13 LAB — BASIC METABOLIC PANEL
Anion gap: 9 (ref 5–15)
BUN: 20 mg/dL (ref 8–23)
CO2: 26 mmol/L (ref 22–32)
Calcium: 8.5 mg/dL — ABNORMAL LOW (ref 8.9–10.3)
Chloride: 93 mmol/L — ABNORMAL LOW (ref 98–111)
Creatinine, Ser: 0.73 mg/dL (ref 0.61–1.24)
GFR, Estimated: >60 mL/min (ref >60)
Glucose, Bld: 141 mg/dL — ABNORMAL HIGH (ref 70–99)
Potassium: 3.6 mmol/L (ref 3.5–5.1)
Sodium: 128 mmol/L — ABNORMAL LOW (ref 135–145)

## 2021-08-13 MED ORDER — ONDANSETRON HCL 4 MG/2ML IJ SOLN: 4.0000 mg | Freq: Four times a day (QID) | INTRAMUSCULAR | Status: DC | PRN

## 2021-08-13 MED ORDER — BIOTENE DRY MOUTH MT LIQD: 15.0000 mL | OROMUCOSAL | Status: DC | PRN

## 2021-08-13 MED ORDER — LORAZEPAM 1 MG PO TABS
1.0000 mg | ORAL_TABLET | ORAL | Status: DC | PRN
  Administered 2021-08-13 – 2021-08-20 (×4): 1 mg via ORAL
  Filled 2021-08-13 (×3): qty 1

## 2021-08-13 MED ORDER — LORAZEPAM 2 MG/ML IJ SOLN
1.0000 mg | INTRAMUSCULAR | Status: DC | PRN
  Administered 2021-08-16: 1 mg via INTRAVENOUS
  Filled 2021-08-13: qty 1

## 2021-08-13 MED ORDER — ACETAMINOPHEN 325 MG PO TABS
650.0000 mg | ORAL_TABLET | Freq: Four times a day (QID) | ORAL | Status: DC | PRN
  Administered 2021-08-13 – 2021-08-20 (×6): 650 mg via ORAL
  Filled 2021-08-13 (×6): qty 2

## 2021-08-13 MED ORDER — MORPHINE SULFATE (PF) 2 MG/ML IV SOLN
1.0000 mg | INTRAVENOUS | Status: DC | PRN
  Administered 2021-08-14 – 2021-08-18 (×11): 1 mg via INTRAVENOUS
  Filled 2021-08-13 (×11): qty 1

## 2021-08-13 MED ORDER — GLYCOPYRROLATE 1 MG PO TABS
1.0000 mg | ORAL_TABLET | ORAL | Status: DC | PRN
  Filled 2021-08-13: qty 1

## 2021-08-13 MED ORDER — GLYCOPYRROLATE 0.2 MG/ML IJ SOLN: 0.2000 mg | INTRAMUSCULAR | Status: DC | PRN

## 2021-08-13 MED ORDER — POLYVINYL ALCOHOL 1.4 % OP SOLN
1.0000 [drp] | Freq: Four times a day (QID) | OPHTHALMIC | Status: DC | PRN
  Filled 2021-08-13: qty 15

## 2021-08-13 MED ORDER — HALOPERIDOL 0.5 MG PO TABS
0.5000 mg | ORAL_TABLET | ORAL | Status: DC | PRN
  Filled 2021-08-13: qty 1

## 2021-08-13 MED ORDER — HALOPERIDOL LACTATE 2 MG/ML PO CONC
0.5000 mg | ORAL | Status: DC | PRN
  Filled 2021-08-13: qty 0.3

## 2021-08-13 MED ORDER — ONDANSETRON 4 MG PO TBDP: 4.0000 mg | ORAL_TABLET | Freq: Four times a day (QID) | ORAL | Status: DC | PRN

## 2021-08-13 MED ORDER — LORAZEPAM 2 MG/ML PO CONC: 1.0000 mg | ORAL | Status: DC | PRN

## 2021-08-13 MED ORDER — HALOPERIDOL LACTATE 5 MG/ML IJ SOLN: 0.5000 mg | INTRAMUSCULAR | Status: DC | PRN

## 2021-08-13 MED ORDER — BISACODYL 10 MG RE SUPP: 10.0000 mg | Freq: Every day | RECTAL | Status: DC | PRN

## 2021-08-13 MED ORDER — SENNA 8.6 MG PO TABS: 1.0000 | ORAL_TABLET | Freq: Every evening | ORAL | Status: DC | PRN

## 2021-08-13 NOTE — Progress Notes (Signed)
Called [employer] to have rep come and deactivate ICD.

## 2021-08-13 NOTE — Progress Notes (Signed)
FPTS Brief Note Reviewed patient's vitals, recent notes.  Vitals:   08/13/21 1158 08/13/21 1532  BP: 138/83 (!) 149/82  Pulse: 83 89  Resp: (!) 23 18  Temp: 97.9 F (36.6 C) 97.8 F (36.6 C)  SpO2: 98% 100%   Patient transitioned to comfort care today. Await placement at hospice facility.   At this time, no change in plan from day progress note.  Lyndee Hensen, DO Page [phone] with questions about this patient.

## 2021-08-13 NOTE — TOC Progression Note (Signed)
Transition of Care [facility]) - Progression Note    Patient Details  Name: Ronald Sheppard MRN: [id number] Date of Birth: Apr 17, 1941  Transition of [facility] CM/SW [place], [place] Phone Number: 08/13/2021, 4:46 PM  Clinical Narrative:      CSW faxed referral to [facility] at [phone].   Expected Discharge Plan: [place] Barriers to Discharge: Continued Medical Work up, SNF Pending bed offer  Expected Discharge Plan and Services Expected Discharge Plan: [place] In-house Referral: Clinical Social Work   Post Acute Care Choice: [place] Living arrangements for the past 2 months: Single Family Home                                       Social Determinants of Health (SDOH) Interventions    Readmission Risk Interventions No flowsheet data found.

## 2021-08-13 NOTE — Progress Notes (Signed)
Family Medicine Teaching Service Daily Progress Note Intern Pager: [phone]  Patient name: Ronald Sheppard Medical record number: [id number] Date of birth: 09-17-1940 Age: 81 y.o. Gender: male  Primary Care Provider: Raina Mina., MD Consultants: Neurology, urology, neuro oncology Code Status: Full  Pt Overview and Major Events to Date:  1/26 admitted 1/28 Foley exchange 2/2 LP insufficient fluid 2/3 fluoroscopic LP 2/4 if cytology negative, neurooncology consulted  Assessment and Plan:  Ronald Sheppard is a 81 year old male presenting with falls now with concern for malignancy.  PMH significant for MI with pacemaker and defibrillator, CAD s/p stent, cerebellar stroke, HTN, HLD, BPH and A-fib on Eliquis  Isolated left lower extremity weakness with lumbosacral radiculopathy   conus medullaris lesion, concern for malignancy Has discussed with neuro-oncology extensively about suspected malignancy and leptomeningeal disease.  Palliative discussion with daughter to occur today to distinguish goals of care more clearly.  Patient continues to have left lower extremity weakness with difficulty wiggling his toes on the side and decreased sensation up to his knee on the left side. -Neuro oncology care and recommendations appreciated -Palliative consulted, appreciate care and recommendations  Sacral ulcer Unstageable wound to sacrum.  Eschar obscuring to wound per [place] nurse.  Mattress replacement is in place. -Nutrition consulted, appreciate recommendations -Consider general surgery consultation pending palliative conversation -Wound care routinely -Frequent repositioning  Urinary retention   glans penis and elevated PSA Urine output was 1.15 L.  Urology considering suprapubic catheter tube at later time as well.  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 -Salt tablets 3 times daily -AM BMP  HTN Blood pressure ranges have been 123-142/73-82. -Monitor vitals  Chronic/stable HLD-atorvastatin PAF-heart rates have been stable, continue Eliquis  FEN/GI: Regular diet PPx: Eliquis 5 mg twice daily Dispo: Pending palliative discussion and safe disposition, likely SNF  Subjective:  No acute issues this morning but was thirsty on examination.  Continues to have left lower extremity weakness but denies any pain currently.  Objective: Temp:  [98 F (36.7 C)-99.2 F (37.3 C)] 98.2 F (36.8 C) (02/09 0000) Pulse Rate:  [67-81] 80 (02/09 0000) Resp:  [18-25] 21 (02/09 0000) BP: (123-142)/(73-82) 136/81 (02/09 0000) SpO2:  [95 %] 95 % (02/09 0000) Physical Exam: General: NAD, laying in bed comfortably, alert and responsive to questions, hard of hearing Cardiovascular: RRR no murmurs rubs or gallops cap refill brisk in lower extremity Respiratory: Clear to auscultation bilaterally no wheezes rales or crackles, speaking full sentences Abdomen: Nontender to palpation, soft Extremities: Offloading devices in place, left leg unable to move toes well, sensation loss up to knee on left side  Laboratory: Recent Labs  Lab 08/11/21 0103 08/12/21 0106 08/13/21 0335  WBC 11.8* 11.3* 11.6*  HGB 12.6* 12.3* 12.5*  HCT 37.1* 36.2* 36.5*  PLT 256 260 301   Recent Labs  Lab 08/07/21 1903 08/08/21 0158 08/11/21 0103 08/12/21 0106 08/13/21 0335  NA  --    < > 128* 126* 128*  K  --    < > 4.0 3.6 3.6  CL  --    < > 93* 91* 93*  CO2  --    < > 25 25 26   BUN  --    < > 14 15 20   CREATININE  --    < > 0.66 0.63 0.73  CALCIUM  --    < > 8.4*  8.3* 8.5*  PROT 6.4*  --   --  5.9*  --   BILITOT  --   --   --  0.7  --   ALKPHOS  --   --   --  53  --   ALT  --   --   --  16  --   AST  --   --   --  12*  --   GLUCOSE  --    < > 115* 118* 141*   < > = values in this interval not displayed.      Imaging/Diagnostic Tests: No  results found.   Gerrit Heck, MD 08/13/2021, 6:58 AM PGY-1, [place] Intern pager: [phone], text pages welcome

## 2021-08-13 NOTE — Consult Note (Addendum)
Palliative Care Consult Note                                  Date: 08/13/2021   Patient Name: Ronald Sheppard  DOB: 12-Feb-1941  MRN: [id number]  Age / Sex: 81 y.o., male  PCP: Raina Mina., MD Referring Physician: Martyn Malay, MD  Reason for Consultation: Establishing goals of care  HPI/Patient Profile: 81 y.o. male  with past medical history of MI with pacemaker and defibrillator, CAD s/p stent, cerebellar stroke, HTN, HLD, BPH and A-fib on Eliquis admitted on 07/30/2021 with falls now with concern for malignancy.  During work-up noted impairment of balance and several unprovoked falls with lower back and leg/hip pain as well as urinary retention.  Symptoms have been stepwise and progressive in nature.  Noted lesion in the conus medullary's cauda equina.  MRI with tumor genetic foci in various locations along the spine and brainstem consistent with carcinogenic leptomeningeal dissemination.  Unknown primary but likely a lymphoma or small cell lung cancer.  LP has been completed x1.  Plans for 2 further LPs to analyze for cytology as well as possible PET scan imaging.  No past medical history on file.  Subjective:   This NP Walden Field reviewed medical records, received report from team, assessed the patient and then meet at the patient's bedside to discuss diagnosis, prognosis, GOC, EOL wishes disposition and options.  I met with the patient, his daughter and son-in-law at the bedside.  I was joined by family medicine residents for family meeting/goals of care discussion.   Concept of Palliative Care was introduced as specialized medical care for people and their families living with serious illness.  If focuses on providing relief from the symptoms and stress of a serious illness.  The goal is to improve quality of life for both the patient and the family. Values and goals of care important to patient and family were attempted to be  elicited.  Created space and opportunity for patient  and family to explore thoughts and feelings regarding current medical situation   Natural trajectory and current clinical status were discussed. Questions and concerns addressed. Patient  encouraged to call with questions or concerns.    Patient/Family Understanding of Illness: He understands that he likely has cancer and "only 2 to 3 weeks left".  We had an extensive discussion reviewing all aspects of his clinical situation.  Likely advanced cancer, although technically unknown primary at this point.  He is expected to progressively decline and leptomeningeal dissemination is indicative of end-stage and vastly advanced cancer.  Goals: Comfort, dignity, quality  Today's Discussion: After we discussed this significant clinical situation.  We began having discussions on what he would and would not want.  He is clear that he does not want further lumbar punctures.  He states that if it was cancer that he would not likely do treatment, although we discussed that there is not likely a curative treatment to be had.  However, I was clear that oncology would need to further evaluate.  Given his feelings about not pursuing treatment we discussed comfort care as an alternative approach to aggressive care.  We explained the hospice philosophy and options for comfort care in the hospital with discharge to residential hospice.  He is in agreement with this.  He states "I have had a good life and if this is it I want to be comfortable for  it."  He does discuss his desire to get out of bed possibly into the chair and we discussed that this can happen with staff assistance, although he is not safe to ambulate independently.  He understands.  Given his home environment of a large house that is not able to be adequately heated his daughter feels that home hospice is not an option and they prefer residential placement.  I agree that he is appropriate for residential  hospice.  We discussed possible hospice organizations in the area.  The daughter states that she would like him closer to where she lives in [place].  We agreed to contact [name] in [place] for formal evaluation.  We discussed other backup plans including hospice of the [place].  All are in agreement to engage with hospice.  I provided emotional general support through therapeutic listening, therapeutic silence, empathy, sharing of stories, and other techniques.  I answered all questions and addressed all concerns to the best of my ability.  Review of Systems  Respiratory:  Negative for choking and shortness of breath.   Gastrointestinal:  Negative for abdominal pain, nausea and vomiting.  Neurological:  Positive for weakness.   Objective:   Primary Diagnoses: Present on Admission: **None**   Physical Exam Vitals and nursing note reviewed.  Constitutional:      General: He is not in acute distress.    Appearance: He is ill-appearing. He is not toxic-appearing.  HENT:     Head: Normocephalic and atraumatic.  Cardiovascular:     Rate and Rhythm: Normal rate.  Pulmonary:     Effort: No respiratory distress.     Breath sounds: No wheezing or rhonchi.  Abdominal:     General: Abdomen is protuberant.     Palpations: Abdomen is soft.  Skin:    General: Skin is warm and dry.  Neurological:     Mental Status: He is alert.  Psychiatric:        Mood and Affect: Mood normal.        Behavior: Behavior normal.    Vital Signs:  BP 138/83 (BP Location: Right Arm)    Pulse 83    Temp 97.9 F (36.6 C) (Axillary)    Resp (!) 23    Ht 6' (1.829 m) Comment: Per recent note from Care Everywhere   SpO2 98%   Palliative Assessment/Data: 30-40%    Advanced Care Planning:   Primary Decision Maker: PATIENT  Code Status/Advance Care Planning: DNR  A discussion was had today regarding advanced directives. Concepts specific to code status, artifical feeding and  hydration, continued IV antibiotics and rehospitalization was had.  The difference between a aggressive medical intervention path and a palliative comfort care path for this patient at this time was had.  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  Overall he has had progressive weakness and subsequent falls.  His disease is progressive and he is aware that it appears end-of-life.  He does not want further aggressive treatment or work-up.  He has settled on comfort care and has requested engagement of hospice services.  I have notified hospice at [facility] in [place] and the Education officer, museum as well.  Overall prognosis is quite poor.  SUMMARY OF RECOMMENDATIONS   Change patient to DNR [place] form has been completed and placed on the chart Transition patient to comfort care while inpatient Referral to [facility] for engagement of [name]  in [facility] Inpatient symptom management as per below PMT will continue to monitor  Symptom Management:  Bowel management with Dulcolax suppository for moderate constipation and senna tablet for mild constipation Zofran 4 mg every 4 hours as needed for nausea Ativan 1 mg every 4 hours as needed for anxiety Haldol 0.5 mg every 4 hours as needed for agitation Robinul 1 mg oral or 0.2 mg IV every 4 hours as needed for excessive secretions Morphine 1 mg IV every 2 hours as needed for pain or dyspnea  Prognosis:  < 4 weeks  Discharge Planning:  Hospice facility   Discussed with: Patient, patient's family, medical team, [facility] colleague, hospice liaison    Thank you for allowing [place] to participate in the care of Kodie Kishi PMT will continue to support holistically.  Time Total: 150 min  Greater than 50%  of this time was spent counseling and coordinating care related to the above assessment and plan.  Signed by: Walden Field,  NP Palliative Medicine Team  Team Phone # [phone] (Nights/Weekends)  08/13/2021, 2:43 PM

## 2021-08-13 NOTE — Progress Notes (Signed)
FPTS Interim Progress Note  Attended family meeting this afternoon with Dr. Jinny Sanders and palliative care team. Daughter, Erline Levine, and her husband were also present during goals of care discussion. Ultimately after extensive discussion, patient is wanting to change code status to DNR and gearing towards hospice. We discussed that the purpose of ongoing workup is to determine particularly more information regarding this likely malignancy. He shares that he would not want to undergo any type of chemotherapy or radiation even if he did have more information regarding his diagnosis. Palliative team will work to get his defibrillator turned off. Shift to comfort care, palliative team will work with social work to determine placement as patient will likely be in a hospice facility as daughter's and patient's homes are not ideal. Family seems against surgery at this time for possible debridement of sacral ulcer so will continue to dressing changes. Daughter and son-in-law are in agreement with patient's decisions.  Palliative care note to follow. Appreciate recommendations and continued involvement of palliative care team.   Donney Dice, DO 08/13/2021, 2:12 PM PGY-2, [place] Medicine Service pager [phone]

## 2021-08-13 NOTE — Progress Notes (Addendum)
FMTS Attending Daily Note: Dorris Singh, MD  Team Pager [phone] Pager [phone]  I have seen and examined this patient, reviewed their chart. I have discussed this patient with the resident physician.  Addendums to below note include:  Edits within note   I agree with the remainder of the findings, exam, and plan below.   Disposition: Hospice--residential, stable for transfer when bed available     Family Medicine Teaching Service Daily Progress Note Intern Pager: [phone]  Patient name: Ronald Sheppard Medical record number: [id number] Date of birth: [birth year] Age: 81 y.o. Gender: male  Primary Care Provider: Raina Mina., MD Consultants: Neurology, urology, neurooncology, all signed off. Palliative Code Status: DNR  Pt Overview and Major Events to Date:  1/26 admitted 1/28 Foley exchange 2/2 LP insufficient fluid 2/3 fluoroscopic LP 2/4 if cytology negative, neurooncology consulted 2/9 comfort care decided after [place] conversation  Assessment and Plan:  Ronald Sheppard is a 81 year old male presenting with falls now with concern for advanced stage malignancy now pursuing comfort measures.  PMH significant for MI with pacemaker and defibrillator, CAD s/p stent, cerebellar stroke, HTN, HLD, BPH and A-fib on Eliquis  Isolated LLE weakness with lumbosacral radiculopathy   conus medullaris lesion, concern for malignancy  After palliative discussion yesterday with daughter, son-in-law and Mr. Clymer patient and family decided to go full comfort care.  -Awaiting hospice placement -Palliative following, appreciate recommendations and care -Monitor and treat symptoms of pain -morphine 1 mg q2h prn  Sacral ulcer Unstageable wound to sacrum.  Eschar obscuring wound per [facility] nurse.  Mattress for placement is in place.  Family decided to General surgery consultation not within what they would like. 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  Said he did have some back pain a little while ago and I encouraged him to ask for pain medications if needed  Objective: Temp:  [97.8 F (36.6 C)-97.9 F (36.6 C)] 97.8 F (36.6 C) (02/09 1532) Pulse Rate:  [83-89] 89 (02/09 1532) Resp:  [18-23] 18 (02/09 1532) BP: (137-149)/(76-83) 137/76 (02/10 0845) SpO2:  [98 %-100 %] 100 % (02/09 1532) Physical Exam: General: NAD, laying in bed comfortably, alert and responsive to all questions Cardiovascular: RRR no murmurs rubs or gallops Respiratory: Clear to auscultation bilaterally no wheezes rales or crackles Abdomen: Nontender to palpation, soft Extremities: Unable to feel sensation on left up   Laboratory: Recent Labs  Lab 08/11/21 0103 08/12/21 0106 08/13/21 0335  WBC 11.8* 11.3* 11.6*  HGB 12.6* 12.3* 12.5*  HCT 37.1* 36.2* 36.5*  PLT 256 260 301   Recent Labs  Lab 08/07/21 1903 08/08/21 0158 08/11/21 0103 08/12/21 0106 08/13/21 0335  NA  --    < > 128* 126* 128*  K  --    < > 4.0 3.6 3.6  CL  --    < > 93* 91* 93*  CO2  --    < >  25 25 26   BUN  --    < > 14 15 20   CREATININE  --    < > 0.66 0.63 0.73  CALCIUM  --    < > 8.4* 8.3* 8.5*  PROT 6.4*  --   --  5.9*  --   BILITOT  --   --   --  0.7  --   ALKPHOS  --   --   --  53  --   ALT  --   --   --  16  --   AST  --    --   --  12*  --   GLUCOSE  --    < > 115* 118* 141*   < > = values in this interval not displayed.      Imaging/Diagnostic Tests:   Gerrit Heck, MD 08/14/2021, 9:32 AM PGY-1, [facility] Intern pager: [phone], text pages welcome

## 2021-08-13 NOTE — Care Management Important Message (Signed)
Important Message  Patient Details  Name: Ronald Sheppard MRN: [id number] Date of Birth: 14-May-1941   Medicare Important Message Given:  Yes     Elvis Boot Montine Circle 08/13/2021, 3:53 PM

## 2021-08-13 NOTE — TOC Progression Note (Signed)
Transition of Care [facility]) - Progression Note    Patient Details  Name: Colson Barco MRN: [id number] Date of Birth: 1941/02/23  Transition of Care [facility]) CM/SW [place], LCSW Phone Number: 08/13/2021, 8:43 AM  Clinical Narrative:    CSW received request from Palliative Care to speak with daughter regarding discharge plan as she is now requesting that patient be placed closer to her in [place] instead of [place]. CSW spoke with Erline Levine and explained that patient would need to try a rehab stay with his Medicare and then would be converted to private pay (~$9,500/month). She expressed concern that patient only had enough maybe for one month of private pay. CSW explained that in order to qualify for Medicaid, patient would have to complete a spend down and then not make over the qualifying amount monthly with social security (he makes $2500/month). She requested SNFs like [facility] in [place]. CSW spoke with Amber there and she stated she can review referral. CSW will fax over once palliative conversation completed.     Expected Discharge Plan: [place] Barriers to Discharge: Continued Medical Work up, SNF Pending bed offer  Expected Discharge Plan and Services Expected Discharge Plan: [place] In-house Referral: Clinical Social Work   Post Acute Care Choice: [place] Living arrangements for the past 2 months: Single Family Home                                       Social Determinants of Health (SDOH) Interventions    Readmission Risk Interventions No flowsheet data found.

## 2021-08-14 DIAGNOSIS — Z515 Encounter for palliative care: Secondary | ICD-10-CM | POA: Diagnosis not present

## 2021-08-14 DIAGNOSIS — G834 Cauda equina syndrome: Secondary | ICD-10-CM | POA: Diagnosis not present

## 2021-08-14 DIAGNOSIS — G96198 Other disorders of meninges, not elsewhere classified: Secondary | ICD-10-CM

## 2021-08-14 LAB — VITAMIN A: Vitamin A (Retinoic Acid): 19.6 ug/dL — ABNORMAL LOW (ref 22.0–69.5)

## 2021-08-14 NOTE — Progress Notes (Signed)
Daily Progress Note   Patient Name: Ronald Sheppard       Date: 08/14/2021 DOB: June 08, 1941  Age: 81 y.o. MRN#: [id number] Attending Physician: Martyn Malay, MD Primary Care Physician: Raina Mina., MD Admit Date: 07/30/2021 Length of Stay: 13 days  Reason for Consultation/Follow-up: Establishing goals of care and Terminal Care  HPI/Patient Profile:  81 y.o. male  with past medical history of MI with pacemaker and defibrillator, CAD s/p stent, cerebellar stroke, HTN, HLD, BPH and A-fib on Eliquis admitted on 07/30/2021 with falls now with concern for malignancy.  During work-up noted impairment of balance and several unprovoked falls with lower back and leg/hip pain as well as urinary retention.  Symptoms have been stepwise and progressive in nature.  Noted lesion in the conus medullary's cauda equina.  MRI with tumor genetic foci in various locations along the spine and brainstem consistent with carcinogenic leptomeningeal dissemination.  Unknown primary but likely a lymphoma or small cell lung cancer.  LP has been completed x1.  Plans for 2 further LPs to analyze for cytology as well as possible PET scan imaging.  Subjective:   Subjective: Chart Reviewed. Updates received. Patient Assessed. Created space and opportunity for patient  and family to explore thoughts and feelings regarding current medical situation.  Today's Discussion: I saw the patient and his friend at the bedside.  He denies any pain, nausea, vomiting, dyspnea.  Overall he feels good.  He did have some pain last night but they gave him a pain pill and it helped tremendously.  He has no concerns or needs today.  Review of Systems  Constitutional:  Positive for fatigue.  Respiratory:  Negative for cough and shortness of breath.   Cardiovascular:  Negative for chest pain.  Gastrointestinal:  Negative for nausea and vomiting.  Neurological:  Positive for weakness.   Objective:   Vital Signs:  BP 137/76    Pulse 89    Temp  97.8 F (36.6 C) (Oral)    Resp 18    Ht 6' (1.829 m) Comment: Per recent note from Care Everywhere   SpO2 100%   Physical Exam: Physical Exam Vitals and nursing note reviewed.  Constitutional:      General: He is sleeping. He is not in acute distress.    Appearance: He is ill-appearing. He is not toxic-appearing.  HENT:     Head: Normocephalic and atraumatic.  Cardiovascular:     Rate and Rhythm: Normal rate.  Pulmonary:     Effort: Pulmonary effort is normal. No respiratory distress.  Abdominal:     General: Abdomen is flat.     Palpations: Abdomen is soft.  Skin:    General: Skin is warm and dry.  Neurological:     General: No focal deficit present.     Mental Status: He is easily aroused.  Psychiatric:        Mood and Affect: Mood normal.        Behavior: Behavior normal.    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  Overall he has had progressive weakness and subsequent falls.  His disease is progressive and he is aware that it appears end-of-life.  He does not want further aggressive treatment or work-up.  He has settled on comfort care and has requested engagement of hospice services.  I have notified hospice at [facility] in [place] and the Education officer, museum as well.  Overall prognosis is quite poor.  SUMMARY OF RECOMMENDATIONS   Remain DNR Patient has been accepted to [facility] in [place], [place].  He is on the waiting list for a bed. Continue comfort care in the interim Inpatient symptom management as per below PMT will continue to monitor  Symptom Management:  Bowel management with Dulcolax suppository for moderate constipation and senna tablet for mild constipation Zofran 4 mg every 4 hours as needed for nausea Ativan 1 mg every 4 hours as needed for anxiety Haldol 0.5 mg every 4 hours as  needed for agitation Robinul 1 mg oral or 0.2 mg IV every 4 hours as needed for excessive secretions Morphine 1 mg IV every 2 hours as needed for pain or dyspnea  Code Status: DNR  Prognosis: < 4 weeks  Discharge Planning: Hospice facility  Discussed with: Medical team, nursing team, patient  Thank you for allowing [place] to participate in the care of Ronald Sheppard PMT will continue to support holistically.  Billing by MDM: High Acute or chronic illness posing threat to life, discussion of management plan with healthcare personnel (nursing team, medical team, hospice liaison, TOC), active management of parenteral controlled substances for comfort/end-of-life care  Walden Field, NP Palliative Medicine Team  Team Phone # [phone] (Nights/Weekends)  03/03/2021, 8:17 AM

## 2021-08-14 NOTE — Progress Notes (Signed)
Nutrition Brief Note  Pt now transitioning to comfort care, most likely transfer to hospice facility. Pt already has Ensure ordered, allow to drink as wanted. Will discontinue Juven and MVI.    No further nutrition interventions planned at this time.  Please re-consult as needed.    Roxana Hires, RD, LDN Clinical Dietitian See [facility] for contact information.

## 2021-08-14 NOTE — Treatment Plan (Cosign Needed)
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  Given that patient has decided on hospice and comfort care, it may be best to continue the Foley catheter to drainage as patient has a high likelihood of failing a trial of void.  He also has a high likelihood of incontinence which would risk exacerbating his sacral decubitus ulcer.  Catheter should be exchanged every 4 weeks at his facility   2) Urethral erosion:  Continue to maintain catheter off tension at urethral meatus to prevent further erosion. Utilizing leg securement strap to secure the catheter by the drainage tubing leaving it on no tension at all.   Discussed with Dr. Alinda Money.  Bishop Limbo, MD [facility]

## 2021-08-14 NOTE — Progress Notes (Signed)
FPTS Brief Note Reviewed patient's vitals, recent notes.  Vitals:   08/14/21 1606 08/14/21 2026  BP: 115/71 104/85  Pulse: 87 86  Resp: 18 18  Temp: 98.3 F (36.8 C) 98.3 F (36.8 C)  SpO2:  98%   At this time, no change in plan from day progress note. Awaiting Placement for discharge, comfort care.   Erskine Emery, MD Page [phone] with questions about this patient.

## 2021-08-14 NOTE — TOC Progression Note (Signed)
Transition of Care [facility]) - Progression Note    Patient Details  Name: Ronald Cleckler MRN: [id number] Date of Birth: 10-14-1940  Transition of Care [facility]) CM/SW [place], LCSW Phone Number: 08/14/2021, 10:07 AM  Clinical Narrative:    CSW spoke with Cassandra at [facility]. She stated that patient does qualify for inpatient hospice but there is a wait list. She will contact CSW when bed becomes available.    Expected Discharge Plan: [place] Barriers to Discharge: Continued Medical Work up, SNF Pending bed offer  Expected Discharge Plan and Services Expected Discharge Plan: [place] In-house Referral: Clinical Social Work   Post Acute Care Choice: [place] Living arrangements for the past 2 months: Single Family Home                                       Social Determinants of Health (SDOH) Interventions    Readmission Risk Interventions No flowsheet data found.

## 2021-08-14 NOTE — Progress Notes (Signed)
Patient ID: Ronald Sheppard, male   DOB: 11/03/1940, 81 y.o.   MRN: [id number]  Decision to proceed with hospice care noted.  Would leave indwelling Foley catheter.  Considering poor prognosis, no urologic follow up is necessary.

## 2021-08-15 DIAGNOSIS — G834 Cauda equina syndrome: Secondary | ICD-10-CM | POA: Diagnosis not present

## 2021-08-15 MED ORDER — ASCORBIC ACID 500 MG PO TABS
500.0000 mg | ORAL_TABLET | Freq: Every day | ORAL | Status: DC
  Administered 2021-08-15 – 2021-08-20 (×6): 500 mg via ORAL
  Filled 2021-08-15 (×6): qty 1

## 2021-08-15 NOTE — Progress Notes (Signed)
Daily Progress Note   Patient Name: Ronald Sheppard       Date: 08/15/2021 DOB: 18-Aug-1940  Age: 81 y.o. MRN#: [id number] Attending Physician: Martyn Malay, MD Primary Care Physician: Raina Mina., MD Admit Date: 07/30/2021 Length of Stay: 14 days  Reason for Consultation/Follow-up: Establishing goals of care and Terminal Care  HPI/Patient Profile:  81 y.o. male  with past medical history of MI with pacemaker and defibrillator, CAD s/p stent, cerebellar stroke, HTN, HLD, BPH and A-fib on Eliquis admitted on 07/30/2021 with falls now with concern for malignancy.  During work-up noted impairment of balance and several unprovoked falls with lower back and leg/hip pain as well as urinary retention.  Symptoms have been stepwise and progressive in nature.  Noted lesion in the conus medullary's cauda equina.  MRI with tumor genetic foci in various locations along the spine and brainstem consistent with carcinogenic leptomeningeal dissemination.  Unknown primary but likely a lymphoma or small cell lung cancer.  LP has been completed x1.  Plans for 2 further LPs to analyze for cytology as well as possible PET scan imaging.  Subjective:   Subjective: Chart Reviewed. Updates received. Patient Assessed. Created space and opportunity for patient  and family to explore thoughts and feelings regarding current medical situation.  Today's Discussion: I met with the patient at the bedside today.  No family or friends at bedside during my visit.  The patient denies any significant pain, dyspnea, nausea, vomiting.  No other acute concerns.  He profusely thanked me for my assistance.  We discussed the plan for discharge to hospice facility in [place].  I informed him that they have accepted him but he is just waiting for a bed.  I spoke with the patient's daughter by telephone.  She states that she now has a sense of relief given the care that has been given.  They have been able to have conversations about  wishes at end-of-life.  She expressed gratitude for the care that she has been given here.  I updated her that we are simply waiting on a bed to become available at the hospice facility.  She confirmed that somebody from hospice called her and told her the same.  I provided emotional and general support and therapeutic listening, empathy, sharing stories, and answered all questions and addressed all concerns to the best of my ability.  Review of Systems  Constitutional:  Positive for fatigue.  Respiratory:  Negative for cough and shortness of breath.   Cardiovascular:  Negative for chest pain.  Gastrointestinal:  Negative for nausea and vomiting.  Neurological:  Positive for weakness.   Objective:   Vital Signs:  BP (!) 142/95 (BP Location: Left Arm)    Pulse 73    Temp 98.5 F (36.9 C) (Oral)    Resp 19    Ht 6' (1.829 m) Comment: Per recent note from Care Everywhere   SpO2 100%   Physical Exam: Physical Exam Vitals and nursing note reviewed.  Constitutional:      General: He is sleeping. He is not in acute distress.    Appearance: He is ill-appearing. He is not toxic-appearing.  HENT:     Head: Normocephalic and atraumatic.  Cardiovascular:     Rate and Rhythm: Normal rate.  Pulmonary:     Effort: Pulmonary effort is normal. No respiratory distress.  Abdominal:     General: Abdomen is flat.     Palpations: Abdomen is soft.  Skin:    General: Skin  is warm and dry.  Neurological:     General: No focal deficit present.     Mental Status: He is easily aroused.  Psychiatric:        Mood and Affect: Mood normal.        Behavior: Behavior normal.    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  Overall he has had progressive weakness and subsequent falls.  His disease is progressive and he is aware that it appears end-of-life.  He does not  want further aggressive treatment or work-up.  He has settled on comfort care and has requested engagement of hospice services.  I have notified hospice at [facility] in [place] and the Education officer, museum as well.  Overall prognosis is quite poor.  SUMMARY OF RECOMMENDATIONS   Remain DNR Patient has been accepted to [facility] in [place], [place].  He is on the waiting list for a bed. Continue comfort care in the interim Inpatient symptom management as per below PMT will continue to monitor  Symptom Management:  Bowel management with Dulcolax suppository for moderate constipation and senna tablet for mild constipation Zofran 4 mg every 4 hours as needed for nausea Ativan 1 mg every 4 hours as needed for anxiety Haldol 0.5 mg every 4 hours as needed for agitation Robinul 1 mg oral or 0.2 mg IV every 4 hours as needed for excessive secretions Morphine 1 mg IV every 2 hours as needed for pain or dyspnea  Code Status: DNR  Prognosis: < 4 weeks  Discharge Planning: Hospice facility  Discussed with: Medical team, nursing team, patient  Thank you for allowing [place] to participate in the care of Ronald Sheppard PMT will continue to support holistically.  Billing by MDM: High Acute or chronic illness posing threat to life, discussion of management plan with healthcare personnel (nursing team, medical team, hospice liaison, TOC), active management of parenteral controlled substances for comfort/end-of-life care  Ronald Field, NP Palliative Medicine Team  Team Phone # [phone] (Nights/Weekends)  03/03/2021, 8:17 AM

## 2021-08-15 NOTE — Progress Notes (Signed)
Family Medicine Teaching Service Daily Progress Note Intern Pager: [phone]  Patient name: Ronald Sheppard Medical record number: [id number] Date of birth: 1941/02/28 Age: 81 y.o. Gender: male  Primary Care Provider: Raina Mina., MD Consultants: Neurology, Urology, Neuro-oncology, Palliative Code Status: DNR  Pt Overview and Major Events to Date:  1/26 admitted 1/28 Foley exchange 2/2 LP insufficient fluid 2/3 fluoroscopic LP 2/4 if cytology negative, neurooncology consulted 2/9 comfort care decided after [place] conversation  Assessment and Plan: Ronald Sheppard is a 81 year old male presenting with falls now with concern for advanced stage malignancy now pursuing comfort measures.  PMH significant for MI with pacemaker and defibrillator, CAD s/p stent, cerebellar stroke, HTN, HLD, BPH and A-fib on Eliquis   Concern for malignancy  Conus medullaris lesion   Isolated left lower extremity weakness with lumbar radiculopathy Patient transition to comfort care on February 9th.  Await placement at hospice facility.  Patient medically stable for discharge when bed available. -Palliative care following, appreciate recommendations -TOC following, appreciate assistance with disposition -Ativan 1 mg every 4 hours as needed -Haldol 1 mg every 4 hours as needed -Robinul 1 mg oral or 0.2 mg IV every 4 hours as needed -Morphine 1 mg IV every 2 hours as needed -Zofran 4 mg as needed  -Constipation prophylaxis as needed  Sacral ulcer Per chart review, unstageable.  General surgery consult declined by family. -Continue Santyl ointment daily -Air mattress -RD signed off, appreciate recommendations -Wound RN consulted, appreciate recommendations  Urinary retention   Urethral erosion   elevated PSA Per urology, leave in indwelling Foley catheter given poor prognosis. -Urology signed off, appreciate recommendations -Continue Flomax  Hyponatremia   SIADH -Continue salt tablets 1g 3 times  daily  Hypertension Normotensive -Cardizem 120 mg daily  FEN/GI: Regular diet PPx: None  Disposition: Hospice facility  Subjective:  No significant overnight events   Objective: Temp:  [98.3 F (36.8 C)-98.6 F (37 C)] 98.6 F (37 C) (02/10 2340) Pulse Rate:  [79-87] 79 (02/10 2340) Resp:  [18-19] 19 (02/10 2340) BP: (104-137)/(71-85) 116/76 (02/10 2340) SpO2:  [95 %-98 %] 95 % (02/10 2340) Physical Exam: General: sleeping, in no acute distress  Cardiovascular: RRR Respiratory: CTAB Abdomen: soft, non-tender  Extremities: no edema   Laboratory: Recent Labs  Lab 08/11/21 0103 08/12/21 0106 08/13/21 0335  WBC 11.8* 11.3* 11.6*  HGB 12.6* 12.3* 12.5*  HCT 37.1* 36.2* 36.5*  PLT 256 260 301   Recent Labs  Lab 08/11/21 0103 08/12/21 0106 08/13/21 0335  NA 128* 126* 128*  K 4.0 3.6 3.6  CL 93* 91* 93*  CO2 25 25 26   BUN 14 15 20   CREATININE 0.66 0.63 0.73  CALCIUM 8.4* 8.3* 8.5*  PROT  --  5.9*  --   BILITOT  --  0.7  --   ALKPHOS  --  53  --   ALT  --  16  --   AST  --  12*  --   GLUCOSE 115* 118* 141*      Imaging/Diagnostic Tests: No results found.   Lyndee Hensen, DO 08/15/2021, 6:25 AM   PGY-3, [place] Intern pager: [phone], text pages welcome

## 2021-08-15 NOTE — Progress Notes (Signed)
Family Medicine Teaching Service Daily Progress Note Intern Pager: [phone]  Patient name: Ronald Sheppard Medical record number: [id number] Date of birth: [birth year] Age: 81 y.o. Gender: male  Primary Care Provider: Raina Mina., MD Consultants: Palliative, neurooncology, urology, neurology Code Status: DNR  Pt Overview and Major Events to Date:  1/26 admitted 1/28 Foley exchange 2/2 LP insufficient fluid 2/3 fluoroscopic LP 2/4 if cytology negative, neurooncology consulted 2/9 comfort care decided after [place] conversation    Assessment and Plan: Ronald Sheppard is a 81 year old male presenting with fall and concern for advanced stage malignancy now pursuing comfort measures.  Past medical history significant for MI with pacemaker, defibrillator, CAD status post stent, cerebellar stroke, hypertension, HLD, BPH, A-fib.  Conus medullaris lesion   concern for advanced malignancy   comfort measures: Patient was transferred to comfort care measures on February 9.  On my exam this morning he appears comfortable. He is awaiting placement at hospice facility.  Medically stable for discharge when a bed occurs. -Ativan 1 mg every 4 hours as needed -Haldol 1 mg every 4 hours as needed -Rubendall 1 mg oral or 0.2 mg IV every 4 hours as needed -Morphine 1 mg IV every 2 hours as needed -Zofran 4 mg as needed -Constipation prophylaxis as needed  Sacral ulcer: Per documentation family had previously declined general surgery consult.  Continues with Santyl ointment daily, air mattress.  Comfort measures as discussed above.  Urinary retention: Continue Flomax  FEN/GI: Regular diet PPx: None Dispo: Hospice facility  Subjective:  No distress  Objective: Temp:  [98.5 F (36.9 C)-98.6 F (37 C)] 98.5 F (36.9 C) (02/11 1939) Pulse Rate:  [73-100] 100 (02/11 1939) Resp:  [12-19] 12 (02/11 1939) BP: (116-142)/(76-95) 142/83 (02/11 1939) SpO2:  [95 %-100 %] 100 % (02/11 1939) Physical  Exam: General: Elderly male, sleeping, no acute distress Cardiovascular: RRR Respiratory: CTA B Abdomen: No apparent abdominal discomfort to palpation  Laboratory: Recent Labs  Lab 08/11/21 0103 08/12/21 0106 08/13/21 0335  WBC 11.8* 11.3* 11.6*  HGB 12.6* 12.3* 12.5*  HCT 37.1* 36.2* 36.5*  PLT 256 260 301   Recent Labs  Lab 08/11/21 0103 08/12/21 0106 08/13/21 0335  NA 128* 126* 128*  K 4.0 3.6 3.6  CL 93* 91* 93*  CO2 25 25 26   BUN 14 15 20   CREATININE 0.66 0.63 0.73  CALCIUM 8.4* 8.3* 8.5*  PROT  --  5.9*  --   BILITOT  --  0.7  --   ALKPHOS  --  53  --   ALT  --  16  --   AST  --  12*  --   GLUCOSE 115* 118* 141*      Lurline Del, DO 08/15/2021, 10:23 PM PGY-3, [place] Intern pager: [phone], text pages welcome

## 2021-08-16 DIAGNOSIS — Z515 Encounter for palliative care: Secondary | ICD-10-CM | POA: Diagnosis not present

## 2021-08-16 DIAGNOSIS — G834 Cauda equina syndrome: Secondary | ICD-10-CM | POA: Diagnosis not present

## 2021-08-16 MED ORDER — RISAQUAD PO CAPS
2.0000 | ORAL_CAPSULE | Freq: Two times a day (BID) | ORAL | Status: DC
  Administered 2021-08-16 – 2021-08-20 (×8): 2 via ORAL
  Filled 2021-08-16 (×8): qty 2

## 2021-08-16 NOTE — Progress Notes (Signed)
° °  Palliative Medicine Inpatient Follow Up Note   HPI: 81 year old with PM<H of MI with pacemaker and defibrillator, CAD s/p stent, cerebellar stroke, HTN, Hyperlipidemia, BPH and A-fib on Eliquis admitted 1/26   with progressive weakness and subsequent falls. 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 Transitioned to comfort care 2/9.   Today's Discussion 08/16/2021, no family present in room. Friend Ronald Sheppard present at the beginning of my visit. He had spent the day with Ronald Sheppard.   Chart reviewed inclusive of vital signs, progress notes, laboratory results, and diagnostic images. Report received from nurse, Aldona Bar.  Created space and opportunity for patient to explore thoughts feelings and fears regarding current medical situation. His pain is under control 0/10 currently. He denies dyspnea, nausea, vomiting, or anxiety. No new concerns expressed. He is a little worn out from multiple "blow outs" today (diarrheal BMs). He has enjoyed his time today with his friend Ronald Sheppard. Ronald Sheppard brought him in a home cooked meal and his appetite was good.  Palliative Support Provided.   Objective Assessment: Vital Signs Vitals:   08/15/21 1939 08/16/21 0000  BP: (!) 142/83 (!) 142/95  Pulse: 100 100  Resp: 12 12  Temp: 98.5 F (36.9 C) 98.5 F (36.9 C)  SpO2: 100% 100%    Intake/Output Summary (Last 24 hours) at 08/16/2021 1728 Last data filed at 08/16/2021 8299 Gross per 24 hour  Intake 1200 ml  Output --  Net 1200 ml     Gen:  NAD, ill appearing but not toxic appearing HEENT: pale, moist mucous membranes CV: Regular rate  PULM: respirations nonlabored, normal effort ABD: soft/nontender/nondistended EXT: No edema Neuro: Alert and oriented x3  SUMMARY OF RECOMMENDATIONS   Code status: DNR  Symptom Management:  Comfort Care Bowel management with  Dulcolax suppository for moderate constipation and senna tablet for mild constipation, Probiotics added today as he has had multiple diarrhea stools Zofran 4 mg every 4 hours as needed for nausea Ativan 1 mg every 4 hours as needed for anxiety Haldol 0.5 mg every 4 hours as needed for agitation Robinul 1 mg oral or 0.2 mg IV every 4 hours as needed for excessive secretions Morphine 1 mg IV every 2 hours as needed for pain or dyspnea   Discharge planning:  Hospice facility [name], on wait list for bed.  Prognosis: Poor Thank you for allowing [place] to participate in the care of Ronald Sheppard. Palliative Medicine Team will continue to follow and support  Time Spent: 35 minutes ______________________________________________________________________________________  Ronald Spar, NP [place] Team Team Cell Phone: [phone] Please utilize secure chat with additional questions, if there is no response within 30 minutes please call the above phone number  Palliative Medicine Team providers are available by phone from 7am to 7pm daily and can be reached through the team cell phone.  Should this patient require assistance outside of these hours, please call the patient's attending physician.

## 2021-08-16 NOTE — TOC Progression Note (Signed)
Transition of Care [facility]) - Progression Note    Patient Details  Name: Ronald Sheppard MRN: [id number] Date of Birth: 1941/01/09  Transition of Care [facility]) CM/SW [place], [place] Phone Number: 08/16/2021, 1:18 PM  Clinical Narrative:    CSW followed up with [facility] for placement of pt. CSW spoke with [facility] concerning where pt was on wait list.  Loree Fee will check and return phone call to [place] either on Sunday or Monday for possible placement of pt.  TOC will continue to assist with disposition planning.   Expected Discharge Plan: [place] Barriers to Discharge: Continued Medical Work up, SNF Pending bed offer  Expected Discharge Plan and Services Expected Discharge Plan: [place] In-house Referral: Clinical Social Work   Post Acute Care Choice: [place] Living arrangements for the past 2 months: Single Family Home                                       Social Determinants of Health (SDOH) Interventions    Readmission Risk Interventions No flowsheet data found.

## 2021-08-17 DIAGNOSIS — G834 Cauda equina syndrome: Secondary | ICD-10-CM | POA: Diagnosis not present

## 2021-08-17 NOTE — TOC Progression Note (Addendum)
Transition of Care [facility]) - Progression Note    Patient Details  Name: Ronald Sheppard MRN: [id number] Date of Birth: October 15, 1940  Transition of Care [facility]) CM/SW [place], LCSW Phone Number: 08/17/2021, 8:56 AM  Clinical Narrative:    8:55am-CSW spoke with Angelita Ingles at [facility] regarding status of waitlist. She will find out and call CSW back.   12pm-CSW received return call from [place] with [name] and she stated patient is second on waitlist.   Expected Discharge Plan: [place] Barriers to Discharge: Continued Medical Work up, SNF Pending bed offer  Expected Discharge Plan and Services Expected Discharge Plan: [place] In-house Referral: Clinical Social Work   Post Acute Care Choice: [place] Living arrangements for the past 2 months: Single Family Home                                       Social Determinants of Health (SDOH) Interventions    Readmission Risk Interventions No flowsheet data found.

## 2021-08-17 NOTE — Progress Notes (Signed)
FPTS Brief Note Reviewed patient's vitals, recent notes.  Vitals:   08/16/21 2221 08/16/21 2325  BP: (!) 143/79   Pulse: 73   Resp: 16   Temp:  99.1 F (37.3 C)  SpO2: 100%    At this time, no change in plan from day progress note. Discharge to hospice facility when able.  Lyndee Hensen, DO Page [phone] with questions about this patient.

## 2021-08-17 NOTE — Progress Notes (Signed)
Family Medicine Teaching Service Daily Progress Note Intern Pager: [phone]  Patient name: Ronald Sheppard Medical record number: [id number] Date of birth: [birth year] Age: 81 y.o. Gender: male  Primary Care Provider: Raina Mina., MD Consultants: palliative, neurooncology, urology,  neurology Code Status: DNR  Pt Overview and Major Events to Date:  1/26 admitted 1/28 Foley exchange 2/2 LP insufficient fluid 2/3 fluoroscopic LP 2/4 if cytology negative, neurooncology consulted 2/9 comfort care decided after [place] conversation  Assessment and Plan:  Ronald Sheppard is a 81 year old male presenting with fall and concern for advanced stage malignancy now pursuing comfort measures.  Past medical history significant for MI with pacemaker, defibrillator, CAD status post stent, cerebellar stroke, hypertension, HLD, BPH, A-fib. Medically stable for hospice.   Conus medullaris lesion   concern for advanced malignancy   comfort measures: This morning patient feels no pain currently.  -Ativan 1 mg q4 hr prn -haldol 1 mg q4h prn -morphine 1 mg IV q2h prn -zofran 4 mg prn -robinul 1 mg oral or 0.2 mg IV every 4 hours prn  Sacral Ulcer Had loose BM yesterday that leaked onto ulcer. Flexiseal currently in place. -santyl ointment -air mattress -flexiseal  Urinary retention -continue flomax  FEN/GI: Regular PPx: None Dispo:Hospice, medically stable  Subjective:  Patient denies any pain currently  Objective: Temp:  [99.1 F (37.3 C)] 99.1 F (37.3 C) (02/12 2325) Pulse Rate:  [73] 73 (02/12 2221) Resp:  [16] 16 (02/12 2221) BP: (143)/(79) 143/79 (02/12 2221) SpO2:  [100 %] 100 % (02/12 2221) Physical Exam: General: NAD, alert and responsive to all questions, poor hearing Cardiovascular: RRR no murmurs rubs or gallops Respiratory: Clear to auscultation bilaterally Abdomen: Nontender, soft Extremities: No lower extremity edema, able to move right leg, left leg unable to move    Laboratory: Recent Labs  Lab 08/11/21 0103 08/12/21 0106 08/13/21 0335  WBC 11.8* 11.3* 11.6*  HGB 12.6* 12.3* 12.5*  HCT 37.1* 36.2* 36.5*  PLT 256 260 301   Recent Labs  Lab 08/11/21 0103 08/12/21 0106 08/13/21 0335  NA 128* 126* 128*  K 4.0 3.6 3.6  CL 93* 91* 93*  CO2 25 25 26   BUN 14 15 20   CREATININE 0.66 0.63 0.73  CALCIUM 8.4* 8.3* 8.5*  PROT  --  5.9*  --   BILITOT  --  0.7  --   ALKPHOS  --  53  --   ALT  --  16  --   AST  --  12*  --   GLUCOSE 115* 118* 141*    Imaging/Diagnostic Tests: No results found.   Ronald Heck, MD 08/17/2021, 5:06 AM PGY-1, [place] Intern pager: [phone], text pages welcome

## 2021-08-17 NOTE — Progress Notes (Signed)
Palliative:  I met today with [name] and a friend at bedside. [name] awakens and reports to me that he has no discomfort and is resting comfortably today. He reports that he feels better than he did yesterday and has no complaints. He does note that he is more sleepy and weak today. He confirms plans to transition to facility in [place] to be closer to his daughter. TOC following to assist with transition to [facility].   No charge  Vinie Sill, NP Palliative Medicine Team Pager [phone] (Please see [email or web address] for schedule) Team Phone [phone]

## 2021-08-18 DIAGNOSIS — R52 Pain, unspecified: Secondary | ICD-10-CM

## 2021-08-18 DIAGNOSIS — G834 Cauda equina syndrome: Secondary | ICD-10-CM | POA: Diagnosis not present

## 2021-08-18 MED ORDER — LORAZEPAM 0.5 MG PO TABS: 0.5000 mg | ORAL_TABLET | Freq: Every day | ORAL | Status: DC

## 2021-08-18 MED ORDER — MORPHINE SULFATE (PF) 2 MG/ML IV SOLN
1.0000 mg | INTRAVENOUS | Status: DC | PRN
  Administered 2021-08-18 – 2021-08-19 (×2): 2 mg via INTRAVENOUS
  Filled 2021-08-18 (×2): qty 1

## 2021-08-18 MED ORDER — LORAZEPAM 0.5 MG PO TABS
0.5000 mg | ORAL_TABLET | Freq: Every day | ORAL | Status: DC
  Administered 2021-08-18 – 2021-08-20 (×2): 1 mg via ORAL
  Filled 2021-08-18 (×3): qty 2

## 2021-08-18 MED ORDER — MORPHINE SULFATE (PF) 2 MG/ML IV SOLN
1.0000 mg | Freq: Three times a day (TID) | INTRAVENOUS | Status: DC
  Administered 2021-08-18 – 2021-08-20 (×6): 1 mg via INTRAVENOUS
  Filled 2021-08-18 (×6): qty 1

## 2021-08-18 NOTE — Progress Notes (Signed)
Spoke with daughter Erline Levine about Ronald Sheppard. Currently on waitlist for [name] and no medication changes. All questions answered, will contact us if she has any questions.

## 2021-08-18 NOTE — Consult Note (Signed)
[place] Nurse Consult Note: Patient has changed over to comfort care. WOC will no longer follow but are available to the patient and medical team if needed. Continue dressing changes to the sacrum as ordered.   Thank you for the consult. [place] nurse will not follow at this time.   Please re-consult the [place] team if needed.  Cathlean Marseilles Tamala Julian, MSN, RN, [place], [place], [facility] Wound Treatment Associate Pager [phone]

## 2021-08-18 NOTE — Progress Notes (Signed)
Palliative:  HPI: 81 year old with PM<H of MI with pacemaker and defibrillator, CAD s/p stent, cerebellar stroke, HTN, Hyperlipidemia, BPH and A-fib on Eliquis admitted 1/26   with progressive weakness and subsequent falls. 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 Transitioned to comfort care 08/13/21.   I met today at Ronald Sheppard bedside. He awakens and talks but unable to stay awake very long. His daughter is present at bedside. She describes some of his discomfort and that his pain fluctuates and sometimes he is not able to appropriately express his pain. He also seems to have some restless leg or jerking type movements. Daughter confirms awaiting for hospice bed in [place]. I reassured daughter that she will hear from CSW/CMRN when bed is available and I understand that he was second on the list as of yesterday. We discussed symptom management and prognosis. We discussed anticipated need for further medication for symptom management relief that will also lead to more lethargy and decreased intake. I do believe that we are seeing some progression and that prognosis is likely weeks or less. He is beginning to exhibit episodes of visual hallucination during my visit as well.   Symptom management and medication review with daughter to better manage symptoms. I also assisted her with completion of FMLA paperwork as well during visit.   All questions/concerns addressed. Emotional support provided.   Plan: - Added scheduled morphine and increased dose range of PRN dosage.  - Scheduled Ativan qhs and PRN doses available for anxiety/restlessness.  - Full comfort care.  - Awaiting hospice placement.   [place], NP Palliative Medicine Team Pager [phone] (Please see [email or web address] for schedule) Team Phone [phone]    Greater than 50%  of this time was spent  counseling and coordinating care related to the above assessment and plan

## 2021-08-18 NOTE — Progress Notes (Signed)
Family Medicine Teaching Service Daily Progress Note Intern Pager: [phone]  Patient name: Ronald Sheppard Medical record number: [id number] Date of birth: 25-Aug-1940 Age: 81 y.o. Gender: male  Primary Care Provider: Raina Mina., MD Consultants:  palliative, neurooncology, urology,  neurology Code Status: DNR  Pt Overview and Major Events to Date:  1/26 admitted 1/28 Foley exchange 2/2 LP insufficient fluid 2/3 fluoroscopic LP 2/4 if cytology negative, neurooncology consulted 2/9 comfort care decided after [place] conversation  Assessment and Plan:  Ronald Sheppard is a 81 year old male presenting with fall and concern for advanced stage malignancy now pursuing comfort measures.  Past medical history significant for MI with pacemaker, defibrillator, CAD status post stent, cerebellar stroke, hypertension, HLD, BPH, A-fib. Medically stable for hospice.   Conus medullaris lesion   concern for advanced malignancy   comfort measures This morning patient is oriented but seems somewhat confused. -ativan 1 mg q4h prn -haldol 1 mg q4h prn -morphine 1 mg IV q2h prn -zofran 4 mg prn -robinul 1 mg oral or 0.2 mg IV q4h prn  Sacral Ulcer Flexiseal still in place due to loose BM/fecal incontinence. -santyl oitnment -air mattress -flexiseal  -prn constipation medications -probiotic  Urianary retention Foley in place -Flomax  FEN/GI: Regular PPx: None Dispo:Hospice, medically stable on waitlist for [name]  Subjective:  No issues or pain currently  Objective: Temp:  [97.5 F (36.4 C)-98.8 F (37.1 C)] 98.3 F (36.8 C) (02/14 0023) Pulse Rate:  [67-84] 84 (02/14 0023) Resp:  [20] 20 (02/14 0023) BP: (122-153)/(71-81) 122/71 (02/14 0023) SpO2:  [90 %] 90 % (02/14 0023) Physical Exam: General: NAD, a&O Cardiovascular: RRR no m/r/g Respiratory: CTAB no w/r/c Abdomen: Nontender, nondistended Extremities: No LE edema, cant move leg leg well   Laboratory: Recent Labs  Lab  08/12/21 0106 08/13/21 0335  WBC 11.3* 11.6*  HGB 12.3* 12.5*  HCT 36.2* 36.5*  PLT 260 301   Recent Labs  Lab 08/12/21 0106 08/13/21 0335  NA 126* 128*  K 3.6 3.6  CL 91* 93*  CO2 25 26  BUN 15 20  CREATININE 0.63 0.73  CALCIUM 8.3* 8.5*  PROT 5.9*  --   BILITOT 0.7  --   ALKPHOS 53  --   ALT 16  --   AST 12*  --   GLUCOSE 118* 141*      Imaging/Diagnostic Tests:   Gerrit Heck, MD 08/18/2021, 5:41 AM PGY-1, [place] Intern pager: [phone], text pages welcome

## 2021-08-18 NOTE — Progress Notes (Signed)
FPTS Brief Note Reviewed patient's vitals, recent notes.  Vitals:   08/18/21 0835 08/18/21 2200  BP: 115/72 119/73  Pulse: 75 (!) 56  Resp: 19 17  Temp: 98 F (36.7 C) 98.8 F (37.1 C)  SpO2: 100% 96%   At this time, no change in plan from day progress note.  Lyndee Hensen, DO Page [phone] with questions about this patient.

## 2021-08-18 NOTE — Progress Notes (Signed)
FPTS Brief Note Reviewed patient's vitals, recent notes.  Vitals:   08/17/21 2039 08/18/21 0023  BP: (!) 153/81 122/71  Pulse: 71 84  Resp: 20 20  Temp: 98.8 F (37.1 C) 98.3 F (36.8 C)  SpO2:  90%   At this time, no change in plan from day progress note.  Per notes, pt is 2nd on the waitlist at desired hospice facility in [place].  Lyndee Hensen, DO Page [phone] with questions about this patient.

## 2021-08-19 DIAGNOSIS — G834 Cauda equina syndrome: Secondary | ICD-10-CM | POA: Diagnosis not present

## 2021-08-19 NOTE — TOC Progression Note (Signed)
Transition of Care [facility]) - Progression Note    Patient Details  Name: Ronald Sheppard MRN: [id number] Date of Birth: 08/28/1940  Transition of Care [facility]) CM/SW [place], LCSW Phone Number: 08/19/2021, 12:05 PM  Clinical Narrative:    Per Fredric Mare, patient is still #2 on waitlist as they had to bring in priority patients. If patient's status/symptom management worsens and needs priority bed, can contact [name] back.    Expected Discharge Plan: [place] Barriers to Discharge: Continued Medical Work up, SNF Pending bed offer  Expected Discharge Plan and Services Expected Discharge Plan: [place] In-house Referral: Clinical Social Work   Post Acute Care Choice: [place] Living arrangements for the past 2 months: Single Family Home                                       Social Determinants of Health (SDOH) Interventions    Readmission Risk Interventions No flowsheet data found.

## 2021-08-19 NOTE — Progress Notes (Signed)
Palliative:  HPI: 81 year old with PM<H of MI with pacemaker and defibrillator, CAD s/p stent, cerebellar stroke, HTN, Hyperlipidemia, BPH and A-fib on Eliquis admitted 1/26   with progressive weakness and subsequent falls. 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 Transitioned to comfort care 08/13/21.    I met today at Ronald Sheppard bedside with his close friend. Ronald Sheppard is awakening less and less but when awake he is still eating fairly well but a little less today. He is having progression over the past few days. I anticipate that he will continue to decline but prognosis could still be a few weeks unless he has more significant decline which is possible as well. He seems comfortable on current regimen. He does awaken during my visit and expresses pain but this will be his only PRN needed since scheduled medication started. RN reports that lorazepam has helped with restless leg and he has been resting comfortably. Discussed with friend at bedside who expresses signs he has seen over the past few days. He continues to be very supportive. He brings him foods that he knows he will enjoy. He questions if he is giving him too much sugar and sweets and I explained that Ronald Sheppard should have whatever he enjoys at this stage.   All questions/concerns addressed. Emotional support provided.   Exam: Lethargic but awakens. Able to answer questions and make his needs known. Confused at times. No distress. Breathing regular, unlabored. Abd distended.   Plan: - No changes to comfort medication regimen.  - Awaiting hospice placement.   [place], NP Palliative Medicine Team Pager [phone] (Please see [email or web address] for schedule) Team Phone [phone]    Greater than 50%  of this time was spent counseling and coordinating care related to the above assessment and plan

## 2021-08-19 NOTE — Progress Notes (Signed)
Family Medicine Teaching Service Daily Progress Note Intern Pager: [phone]  Patient name: Ronald Sheppard Medical record number: [id number] Date of birth: 12/27/1940 Age: 81 y.o. Gender: male  Primary Care Provider: Raina Mina., MD Consultants: palliative, neurooncology, urology,  neurology Code Status: DNR  Pt Overview and Major Events to Date:  1/26 admitted 1/28 Foley exchange 2/2 LP insufficient fluid 2/3 fluoroscopic LP 2/4 if cytology negative, neurooncology consulted 2/9 comfort care decided after [place] conversation    Assessment and Plan:  Ronald Sheppard is a 81 year old male presenting with fall and concern for advanced stage malignancy now pursuing comfort measures.  Past medical history significant for MI with pacemaker, defibrillator, CAD status post stent, cerebellar stroke, hypertension, HLD, BPH, A-fib. Medically stable for hospice.  Conus medullaris lesion   concern for advanced malignancy   comfort measures This morning patient appears worsened was not oriented this morning. -Ativan 1 mg every 4 hours as needed -Haldol 1 milligram every 4 hours as needed -Morphine 1 mg IV q2h prn -Zofran 4 mg prn -Robinul 1 mg oral or 0.2 mg IV q4h prn -SW and palliative following for dispo of hospice placement  Sacral Ulcer Flexiseal in place due to loose BM and fecal incontinence -santyl ointment  -air mattress -flexiseal -prn constipation meds -probiotic  Urinary retention Foley in place -flomax  FEN/GI: Regular PPx: None Dispo:Hospice pending bed availability  Subjective:  Patient had no complaints this morning  Objective: Temp:  [98 F (36.7 C)-98.9 F (37.2 C)] 98.9 F (37.2 C) (02/15 0602) Pulse Rate:  [56-91] 91 (02/15 0602) Resp:  [17-19] 18 (02/15 0602) BP: (115-124)/(61-73) 124/61 (02/15 0602) SpO2:  [93 %-100 %] 93 % (02/15 0602) Physical Exam: General: NAD, laying in bed comfortably, oriented to person, some dysarthria Cardiovascular: RRR no  murmurs rubs or gallops Respiratory: Clear to auscultation bilaterally Abdomen: Nontender to palpation Extremities: Left lower extremity unable to move well, right lower extremity without edema  Laboratory: Recent Labs  Lab 08/13/21 0335  WBC 11.6*  HGB 12.5*  HCT 36.5*  PLT 301   Recent Labs  Lab 08/13/21 0335  NA 128*  K 3.6  CL 93*  CO2 26  BUN 20  CREATININE 0.73  CALCIUM 8.5*  GLUCOSE 141*    Imaging/Diagnostic Tests:   Gerrit Heck, MD 08/19/2021, 7:05 AM PGY-1, [place] Intern pager: [phone], text pages welcome

## 2021-08-20 ENCOUNTER — Encounter (HOSPITAL_COMMUNITY): Payer: Self-pay | Admitting: Student

## 2021-08-20 DIAGNOSIS — R531 Weakness: Secondary | ICD-10-CM | POA: Diagnosis not present

## 2021-08-20 MED ORDER — MORPHINE SULFATE (PF) 2 MG/ML IV SOLN: 1.0000 mg | Freq: Three times a day (TID) | INTRAVENOUS | 0 refills | Status: DC

## 2021-08-20 MED ORDER — HALOPERIDOL 0.5 MG PO TABS: 0.5000 mg | ORAL_TABLET | ORAL | Status: DC | PRN

## 2021-08-20 MED ORDER — PANTOPRAZOLE SODIUM 20 MG PO TBEC: 20.0000 mg | DELAYED_RELEASE_TABLET | Freq: Every day | ORAL | Status: DC

## 2021-08-20 MED ORDER — GLYCOPYRROLATE 0.2 MG/ML IJ SOLN: 0.2000 mg | INTRAMUSCULAR | Status: DC | PRN

## 2021-08-20 MED ORDER — LORAZEPAM 1 MG PO TABS: 1.0000 mg | ORAL_TABLET | ORAL | 0 refills | Status: DC | PRN

## 2021-08-20 MED ORDER — ACETAMINOPHEN 325 MG PO TABS: 650.0000 mg | ORAL_TABLET | Freq: Four times a day (QID) | ORAL | Status: DC | PRN

## 2021-08-20 MED ORDER — ONDANSETRON 4 MG PO TBDP: 4.0000 mg | ORAL_TABLET | Freq: Four times a day (QID) | ORAL | 0 refills | Status: DC | PRN

## 2021-08-20 MED ORDER — SENNA 8.6 MG PO TABS: 1.0000 | ORAL_TABLET | Freq: Every day | ORAL | Status: DC

## 2021-08-20 MED ORDER — ONDANSETRON HCL 4 MG/2ML IJ SOLN: 4.0000 mg | Freq: Four times a day (QID) | INTRAMUSCULAR | 0 refills | Status: DC | PRN

## 2021-08-20 MED ORDER — BIOTENE DRY MOUTH MT LIQD: 15.0000 mL | OROMUCOSAL | Status: DC | PRN

## 2021-08-20 MED ORDER — SODIUM CHLORIDE 1 G PO TABS: 1.0000 g | ORAL_TABLET | Freq: Three times a day (TID) | ORAL | Status: DC

## 2021-08-20 MED ORDER — MORPHINE SULFATE (PF) 2 MG/ML IV SOLN
1.0000 mg | INTRAVENOUS | 0 refills | Status: DC | PRN
Start: 2021-08-20 — End: 2023-08-29

## 2021-08-20 MED ORDER — HALOPERIDOL LACTATE 5 MG/ML IJ SOLN: 0.5000 mg | INTRAMUSCULAR | Status: DC | PRN

## 2021-08-20 MED ORDER — PANTOPRAZOLE SODIUM 20 MG PO TBEC
20.0000 mg | DELAYED_RELEASE_TABLET | Freq: Every day | ORAL | Status: DC
  Administered 2021-08-20: 20 mg via ORAL
  Filled 2021-08-20: qty 1

## 2021-08-20 MED ORDER — ASCORBIC ACID 500 MG PO TABS: 500.0000 mg | ORAL_TABLET | Freq: Every day | ORAL | Status: DC

## 2021-08-20 MED ORDER — LORAZEPAM 0.5 MG PO TABS: 0.5000 mg | ORAL_TABLET | Freq: Every day | ORAL | 0 refills | Status: DC

## 2021-08-20 MED ORDER — COLLAGENASE 250 UNIT/GM EX OINT: TOPICAL_OINTMENT | Freq: Every day | CUTANEOUS | 0 refills | Status: DC

## 2021-08-20 MED ORDER — RISAQUAD PO CAPS: 2.0000 | ORAL_CAPSULE | Freq: Two times a day (BID) | ORAL | Status: DC

## 2021-08-20 MED ORDER — POLYVINYL ALCOHOL 1.4 % OP SOLN
1.0000 [drp] | Freq: Four times a day (QID) | OPHTHALMIC | 0 refills | Status: DC | PRN
Start: 2021-08-20 — End: 2023-08-29

## 2021-08-20 MED ORDER — LORAZEPAM 2 MG/ML IJ SOLN: 1.0000 mg | INTRAMUSCULAR | 0 refills | Status: DC | PRN

## 2021-08-20 MED ORDER — SENNA 8.6 MG PO TABS: 1.0000 | ORAL_TABLET | Freq: Every day | ORAL | 0 refills | Status: DC

## 2021-08-20 MED ORDER — LORAZEPAM 2 MG/ML PO CONC: 1.0000 mg | ORAL | 0 refills | Status: DC | PRN

## 2021-08-20 MED ORDER — BISACODYL 10 MG RE SUPP: 10.0000 mg | Freq: Every day | RECTAL | 0 refills | Status: DC | PRN

## 2021-08-20 MED ORDER — HALOPERIDOL LACTATE 2 MG/ML PO CONC: 0.6000 mg | ORAL | 0 refills | Status: DC | PRN

## 2021-08-20 MED ORDER — ENSURE ENLIVE PO LIQD
237.0000 mL | Freq: Two times a day (BID) | ORAL | 12 refills | Status: DC
Start: 2021-08-21 — End: 2023-08-29

## 2021-08-20 MED ORDER — GLYCOPYRROLATE 1 MG PO TABS: 1.0000 mg | ORAL_TABLET | ORAL | Status: DC | PRN

## 2021-08-20 NOTE — TOC Transition Note (Signed)
Transition of Care [facility]) - CM/SW Discharge Note   Patient Details  Name: Ronald Sheppard MRN: [id number] Date of Birth: Mar 08, 1941  Transition of Care [facility]) CM/SW Contact:  Verdell Carmine, RN Phone Number: 08/20/2021, 3:53 PM   Clinical Narrative:    Patient has been accepted to [facility] and will transfer to [facility]  this evening. Messaged RN with information and where to call report [phone] [facility] for transport.        Barriers to Discharge: Continued Medical Work up, SNF Pending bed offer   Patient Goals and CMS Choice Patient states their goals for this hospitalization and ongoing recovery are:: Get better soon CMS Medicare.gov Compare Post Acute Care list provided to:: Other (Comment Required) Choice offered to / list presented to : Adult Children  Discharge Placement                       Discharge Plan and Services In-house Referral: Clinical Social Work   Post Acute Care Choice: [place]                               Social Determinants of Health (SDOH) Interventions     Readmission Risk Interventions No flowsheet data found.

## 2021-08-20 NOTE — Progress Notes (Signed)
Report given to [facility] RN, Legrand Como.  All questions related to IV, physical assessment, VS, medications, and mobility answered.  No additional questions.  PTAR to transport but time unknown.  Either myself or oncoming RN will call nursing station at [facility] at [phone] when patient is discharged and picked up.

## 2021-08-20 NOTE — Progress Notes (Signed)
Patient discharged via PT[facility] to [facility] tonight at 2150.   Foley, Rectal tube, and IV intact per order.  No questions or complaints of pain at time of discharge.  VSS

## 2021-08-20 NOTE — Progress Notes (Signed)
Daily Progress Note   Patient Name: Ronald Sheppard       Date: 08/20/2021 DOB: 07-Sep-1940  Age: 81 y.o. MRN#: [id number] Attending Physician: Lind Covert, MD Primary Care Physician: Raina Mina., MD Admit Date: 07/30/2021  Reason for Consultation/Follow-up: Terminal Care  Patient Profile/HPI:  81 year old with PM<H of MI with pacemaker and defibrillator, CAD s/p stent, cerebellar stroke, HTN, Hyperlipidemia, BPH and A-fib on Eliquis admitted 1/26   with progressive weakness and subsequent falls. 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 Transitioned to comfort care 08/13/21.    Subjective: Chart and progress notes reviewed. Waiting on a bed at [place].  Has morphine IV 1mg  IV scheduled q8 hours. 1-2 mg IV morphine prn q2hrs- total 5mg  IV in the last 24 hours. Lorazepam 1mg  po at bedtime. Daughter at beside- no other prn meds needed. She reports good symptom control- some stomach pain immediately after eating, but has resolved.  He was very lethargic last night, but more awake this morning.   Review of Systems  Unable to perform ROS: Mental status change    Physical Exam Vitals and nursing note reviewed.  Constitutional:      Appearance: He is obese.  Cardiovascular:     Rate and Rhythm: Normal rate.  Pulmonary:     Effort: Pulmonary effort is normal.     Comments: Intermittent cough Neurological:     Mental Status: He is alert.     Comments: lethargic            Vital Signs: BP (!) 149/102 (BP Location: Left Arm)    Pulse 86    Temp 99.7 F (37.6 C) (Axillary)    Resp 14    Ht 6' (1.829 m) Comment: Per recent note from Care  Everywhere   SpO2 95%  SpO2: SpO2: 95 % O2 Device: O2 Device: Room Air O2 Flow Rate: O2 Flow Rate (L/min): 0 L/min  Intake/output summary:  Intake/Output Summary (Last 24 hours) at 08/20/2021 1007 Last data filed at 08/20/2021 1443 Gross per 24 hour  Intake 440 ml  Output 1725 ml  Net -1285 ml   LBM: Last BM Date : 08/18/21 Baseline Weight:   Most recent weight:  Palliative Assessment/Data: PPS: 30%      Patient Active Problem List   Diagnosis Date Noted   Cauda equina syndrome with neurogenic bladder ([place]) 08/04/2021   Generalized weakness 08/01/2021   Weakness 07/31/2021   Pressure injury of skin 07/31/2021   Pacemaker    Frequent falls    Lumbar radiculopathy    Cerebrovascular disease    Late effects of CVA (cerebrovascular accident)    Coronary artery disease involving native heart without angina pectoris    Cerebellar infarct ([place]) 07/28/2015   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 ([place]) 07/28/2015   Idiopathic peripheral neuropathy 07/28/2015   Risk for falls 07/28/2015    Palliative Care Assessment & Plan    Assessment/Recommendations/Plan  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  Thank you for allowing the Palliative Medicine Team to assist in the care of this patient.   Mariana Kaufman, AGNP-C Palliative Medicine   Please contact Palliative Medicine Team phone at [phone] for questions and concerns.

## 2021-08-20 NOTE — Progress Notes (Signed)
Family Medicine Teaching Service Daily Progress Note Intern Pager: [phone]  Patient name: Hatem Cull Medical record number: [id number] Date of birth: Feb 12, 1941 Age: 81 y.o. Gender: male  Primary Care Provider: Raina Mina., MD Consultants: Palliative, neuro oncology, urology, neurology Code Status: DNR  Pt Overview and Major Events to Date:  1/2-admitted -2/3 fluoroscopic LP -2/9 decision for comfort care  Assessment and Plan: Patient is a 81 year old male presented after a fall with concern for advanced malignancy who is now pursuing comfort measures.  PMH significant for MI with pacemaker, defibrillator, CAD s/p stent, cerebellar stroke, HTN, HLD, BPH, A-fib.  Patient is medically stable for hospice  Conus medullaris lesion   concern for advanced malignancy   comfort measures -tylenol 650 mg every 6 hours. -Ativan 1 mg every 4 hours as needed -Haldol 1 mg every 4 hours as needed -Morphine 1 mg IV every 2 hours as needed -Zofran 4 mg. -Robinul 1 mg oral or 0.2 mg IV every 4 hours. -SW and palliative following -Cardizem 120 mg daily -Dispo to hospice  Sacral ulcer Flexi-Seal in place Santyl ointment -Air mattress -Flexi-Seal -As needed constipation instructions to -Probiotic  Urinary retention Foley in place -Flomax  FEN/GI: Regular PPx: None Dispo: Hospice pending bed availability  Subjective:  Pt was eating breakfast, states he feels well, has no concerns. Asked where he is, what building he is in.    Objective: Temp:  [98 F (36.7 C)-99.7 F (37.6 C)] 99.7 F (37.6 C) (02/15 1944) Pulse Rate:  [73-91] 86 (02/15 1944) Resp:  [14-18] 14 (02/15 1130) BP: (124-149)/(61-102) 149/102 (02/15 1944) SpO2:  [93 %-95 %] 95 % (02/15 1130) Physical Exam: General: well appearing, very pleasant, NAD Cardiovascular: well perfused Respiratory: normal WOB    Precious Gilding, DO 08/20/2021, 5:41 AM PGY-1, [facility] Intern pager: [phone],  text pages welcome

## 2021-08-20 NOTE — Care Management (Addendum)
Called Ronald Sheppard at [facility] for update on bed availability. Ronald Sheppard called back, they are not likely to have a hospice bed before the beginning of next week. Will discuss with patient.   Called Daughter. [facility] beds in [facility]. Daughter states dont want to take him out of the running for [facility], but agreed to speak to [place].

## 2021-08-20 NOTE — Discharge Summary (Signed)
[facility] Discharge Summary  Patient name: Ronald Sheppard Medical record number: [id number] Date of birth: 1940-09-19 Age: 81 y.o. Gender: male Date of Admission: 07/30/2021  Date of Discharge: 08/20/21 Admitting Physician: Alen Bleacher, MD  Primary Care Provider: Raina Mina., MD Consultants: Palliative care, neuro oncology, urology, neurology  Indication for Hospitalization: Fall and concern for advanced malignancy  Discharge Diagnoses/Problem List:  Principal Problem:   Weakness Active Problems:   Pressure injury of skin   Generalized weakness   Cauda equina syndrome with neurogenic bladder [facility])    Disposition: Hospice  Discharge Condition: Stable  Discharge Exam: Taken from Daily progress note General: well appearing, very pleasant, NAD Cardiovascular: well perfused Respiratory: normal WOB  Brief Hospital Course:  Ronald Sheppard is a 81 y.o. male presenting with fall. PMH is significant for MI w/ pacemake and defibrillator CAD s/p stent, cerebellar stroke, HTN, HLD, PAF, BPH. His hospital course is below, detailed by problem.     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 His daughter reported that patient has had multiple falls within the last couple of weeks and mostly attributed to imbalance. Patient was recently hospitalized a week ago at a Hospital in [place] for urinary retention. He was discharged with recommendations for outpatient PT/OT.  His vitals were stable on admission. initial labs significant for hyponatremia with Na 127, K4.1 and Cr 0.75. CBC showed elevated WBC of 14.2 likely due to acute stress.  Head CT showed no acute cranial hemorrhage but showed multifocal posterior cerebral artery stenosis.  Lumbar spine CT showed multilevel lumbar foraminal stenosis.  Given patient's  imbalance with ambulation, back pain, recent hospitalization for urinary retention and lumbar spine CT finding is highly suspicious of spinal radiculopathy. Patient received MRI Brain, Thoracic spine, and Lumbar spine, that showed abnormal signal ranging from the thoracic spine to the cauda equina. Imaging was concerning for swelling/inflammation of the spinal cord. Neurology recommended CT chest/abd/pelvis and MRI brain w/ contrast. 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 MR Lumbar Spine showed abnormal signal within the conus with extensive abnormal enhancement in the conus, visualized lower thoracic cord, and the cauda equina nerve roots diffusely. CT abdomen showed no acute issues except marked enlargement of prostate suggesting prostatic hypertrophy or neoplasm. PSA was only 12.22. Neurology felt the lesions on the spine where indicative of a malignancy and were requesting an LP.  Fluoroscopic LP showed bloody CSF, over 18,000 RBC, WBC of 235, lymphs of 96, greater than 600 protein.  CSF culture was negative.  Cytology showed no malignant cells.  Neurooncology was consulted for recommendations and commended finding primary source of cancer with FDG-PET imaging, repeat lumbar punctures needing up to 3 CSF cultures to identify leptomeningeal disease. Michela Pitcher that this was most consistent with malignancy presentation.  Palliative team was consulted and goals of care conversation resulted in patient opting for full comfort care route as he did not want to continue testing.  Urinary retention   Lesion on glans Patient reports several weeks of difficulty urinating which she thought was related to his diet.  Reports that over the last few days he has felt like his abdomen is more full and painful.  Since having Foley placed and draining bladder he has had relief in his abdominal pain. Patient  had foley placed for  unknown extended time, that was changed during admission. During foley change a lesion was noted on the urethral surface of the glans, with pus. Urology was consulted and unsure of etiology of lesion, but determined that it did not need to be treated with antibiotics. Foley was continued and lesion was monitored during hospitalization. Urology recommended indwelling catheter given hospice route and that it should be exchanged every 4 weeks.  Sacral ulcer Unstageable wound to sacrum.  Eschar obscuring to wound per [place] nurse. Air mattress replacement was given for patient in hospital.  Nutrition was consulted for their recommendations.  General surgery consultation was deferred given patient's comfort care goals and after discussing with family and patient.   Medications were optimized to meet comfort care/family and patient goals (atorvastatin, Eliquis Dc'd)  All other conditions chronic and stable Hx of cerebellar stroke Hx of Myocardial infarction   CAD with stents Hx of PAF on Eliquis  HLD HTN  Issues for follow up: Catheter should be exchanged every 4 weeks at his facility Will need routine wound care for sacral ulcer   Significant Procedures: Fluoroscopic guided LP on 08/07/21  Significant Labs and Imaging:  No results for input(s): WBC, HGB, HCT, PLT in the last 168 hours. No results for input(s): NA, K, CL, CO2, GLUCOSE, BUN, CREATININE, CALCIUM, MG, PHOS, ALKPHOS, AST, ALT, ALBUMIN, PROTEIN in the last 168 hours.  Invalid input(s): TBILI    Results/Tests Pending at Time of Discharge: none  Discharge Medications:  Allergies as of 08/20/2021       Reactions   Amlodipine    Other    MSG   Penicillins    Tolerates Rocephin        Medication List     STOP taking these medications    Arginine 1000 MG Tabs   aspirin 325 MG tablet   aspirin EC 81 MG tablet   atorvastatin 40 MG tablet Commonly known as: LIPITOR   Co-Enzyme Q-10 30 MG Caps   cyclobenzaprine 10 MG  tablet Commonly known as: FLEXERIL   Eliquis 5 MG Tabs tablet Generic drug: apixaban   halobetasol 0.05 % cream Commonly known as: ULTRAVATE   HAWTHORN PO   Levocarnitine 500 MG Tabs   levofloxacin 500 MG tablet Commonly known as: LEVAQUIN   losartan 50 MG tablet Commonly known as: COZAAR   Magnesium 400 MG Tabs   magnesium citrate Soln   melatonin 3 MG Tabs tablet   Multi-Vitamins Tabs   pravastatin 10 MG tablet Commonly known as: PRAVACHOL   SAW PALMETTO PO   traMADol 50 MG tablet Commonly known as: ULTRAM   tuberculin 5 UNIT/0.1ML injection   Vitamin D3 25 MCG (1000 UT) Caps   vitamin E 180 MG (400 UNITS) capsule   Vitamin-B Complex Tabs   ZINC SULFATE PO       TAKE these medications    acetaminophen 325 MG tablet Commonly known as: TYLENOL Take 2 tablets (650 mg total) by mouth every 6 (six) hours as needed for moderate pain or mild pain. What changed:  when to take this reasons to take this   acidophilus Caps capsule Take 2 capsules by mouth 2 (two) times daily.   antiseptic oral rinse Liqd Apply 15 mLs topically as needed for dry mouth.   ascorbic acid 500 MG tablet Commonly known as: VITAMIN C Take 1 tablet (500 mg total) by mouth daily. Start taking on: August 21, 2021 What changed:  medication strength how much  to take additional instructions Another medication with the same name was removed. Continue taking this medication, and follow the directions you see here.   bisacodyl 10 MG suppository Commonly known as: DULCOLAX Place 1 suppository (10 mg total) rectally daily as needed for moderate constipation.   collagenase ointment Commonly known as: SANTYL Apply topically daily. Start taking on: August 21, 2021   diltiazem 120 MG 24 hr capsule Commonly known as: CARDIZEM CD Take 120 mg by mouth daily.   feeding supplement Liqd Take 237 mLs by mouth 2 (two) times daily between meals. Start taking on: August 21, 2021    glycopyrrolate 1 MG tablet Commonly known as: ROBINUL Take 1 tablet (1 mg total) by mouth every 4 (four) hours as needed (excessive secretions).   glycopyrrolate 0.2 MG/ML injection Commonly known as: ROBINUL Inject 1 mL (0.2 mg total) into the skin every 4 (four) hours as needed (excessive secretions).   glycopyrrolate 0.2 MG/ML injection Commonly known as: ROBINUL Inject 1 mL (0.2 mg total) into the vein every 4 (four) hours as needed (excessive secretions).   haloperidol 0.5 MG tablet Commonly known as: HALDOL Take 1 tablet (0.5 mg total) by mouth every 4 (four) hours as needed for agitation (or delirium).   haloperidol 2 MG/ML solution Commonly known as: HALDOL Place 0.3 mLs (0.6 mg total) under the tongue every 4 (four) hours as needed for agitation (or delirium).   haloperidol lactate 5 MG/ML injection Commonly known as: HALDOL Inject 0.1 mLs (0.5 mg total) into the vein every 4 (four) hours as needed (or delirium).   LORazepam 0.5 MG tablet Commonly known as: ATIVAN Take 1-2 tablets (0.5-1 mg total) by mouth at bedtime.   LORazepam 1 MG tablet Commonly known as: ATIVAN Take 1 tablet (1 mg total) by mouth every 4 (four) hours as needed for anxiety.   LORazepam 2 MG/ML concentrated solution Commonly known as: ATIVAN Place 0.5 mLs (1 mg total) under the tongue every 4 (four) hours as needed for anxiety.   LORazepam 2 MG/ML injection Commonly known as: ATIVAN Inject 0.5 mLs (1 mg total) into the vein every 4 (four) hours as needed for anxiety.   morphine (PF) 2 MG/ML injection Inject 0.5-1 mLs (1-2 mg total) into the vein every 2 (two) hours as needed (or dyspnea).   morphine (PF) 2 MG/ML injection Inject 0.5 mLs (1 mg total) into the vein every 8 (eight) hours.   ondansetron 4 MG disintegrating tablet Commonly known as: ZOFRAN-ODT Take 1 tablet (4 mg total) by mouth every 6 (six) hours as needed for nausea.   ondansetron 4 MG/2ML Soln injection Commonly known  as: ZOFRAN Inject 2 mLs (4 mg total) into the vein every 6 (six) hours as needed for nausea.   pantoprazole 20 MG tablet Commonly known as: PROTONIX Take 1 tablet (20 mg total) by mouth daily. Start taking on: August 21, 2021   polyvinyl alcohol 1.4 % ophthalmic solution Commonly known as: LIQUIFILM TEARS Place 1 drop into both eyes 4 (four) times daily as needed for dry eyes.   senna 8.6 MG Tabs tablet Commonly known as: SENOKOT Take 1 tablet (8.6 mg total) by mouth at bedtime.   sodium chloride 1 g tablet Take 1 tablet (1 g total) by mouth 3 (three) times daily with meals. Start taking on: August 21, 2021   tamsulosin 0.4 MG Caps capsule Commonly known as: FLOMAX Take 0.4 mg by mouth at bedtime.  Discharge Care Instructions  (From admission, onward)           Start     Ordered   08/20/21 0000  Discharge wound care:       Comments: Wound care to sacral wound:  Cleanse with NS, pat dry, cover with collagenase (santyl) in a 1/8 inch thick layer. Top with saline moistened gauze (opened). Cover with dry gauze, ABD pad and secure with tape.  Perform twice daily and PRN soiling.   08/20/21 1734             Follow-Up Appointments:  Contact information for after-discharge care     Destination     [facility] .   Service: Skilled Nursing Contact information: [address] [place] Dawson, [place], DO 08/20/2021, 6:18 PM PGY-1, [place]

## 2021-08-31 DIAGNOSIS — C801 Malignant (primary) neoplasm, unspecified: Secondary | ICD-10-CM

## 2021-08-31 DIAGNOSIS — C7949 Secondary malignant neoplasm of other parts of nervous system: Secondary | ICD-10-CM

## 2021-09-02 DEATH — deceased
